# Patient Record
Sex: Female | Born: 1980 | Race: Black or African American | Hispanic: No | Marital: Married | Smoking: Never smoker
Health system: Southern US, Community
[De-identification: ages and names within clinical notes are randomized; demographics above are authoritative.]

## PROBLEM LIST (undated history)

## (undated) ENCOUNTER — Emergency Department (HOSPITAL_COMMUNITY): Admission: EM | Discharge status: Absent without leave | Payer: 59

## (undated) ENCOUNTER — Inpatient Hospital Stay (HOSPITAL_COMMUNITY): Payer: Self-pay

## (undated) DIAGNOSIS — R002 Palpitations: Secondary | ICD-10-CM

## (undated) DIAGNOSIS — K219 Gastro-esophageal reflux disease without esophagitis: Secondary | ICD-10-CM

## (undated) DIAGNOSIS — M1990 Unspecified osteoarthritis, unspecified site: Secondary | ICD-10-CM

## (undated) DIAGNOSIS — G4711 Idiopathic hypersomnia with long sleep time: Secondary | ICD-10-CM

## (undated) DIAGNOSIS — K589 Irritable bowel syndrome without diarrhea: Secondary | ICD-10-CM

## (undated) DIAGNOSIS — M549 Dorsalgia, unspecified: Secondary | ICD-10-CM

## (undated) DIAGNOSIS — E119 Type 2 diabetes mellitus without complications: Secondary | ICD-10-CM

## (undated) DIAGNOSIS — G629 Polyneuropathy, unspecified: Secondary | ICD-10-CM

## (undated) HISTORY — DX: Polyneuropathy, unspecified: G62.9

## (undated) HISTORY — DX: Type 2 diabetes mellitus without complications: E11.9

## (undated) HISTORY — DX: Idiopathic hypersomnia with long sleep time: G47.11

## (undated) HISTORY — PX: BREAST SURGERY: SHX581

## (undated) HISTORY — DX: Dorsalgia, unspecified: M54.9

---

## 2012-01-20 ENCOUNTER — Emergency Department (HOSPITAL_COMMUNITY)
Admission: EM | Admit: 2012-01-20 | Discharge: 2012-01-20 | Disposition: A | Discharge status: Home / Self Care | Payer: No Typology Code available for payment source | Attending: Emergency Medicine | Admitting: Emergency Medicine

## 2012-01-20 ENCOUNTER — Encounter (HOSPITAL_COMMUNITY): Payer: Self-pay | Admitting: *Deleted

## 2012-01-20 ENCOUNTER — Emergency Department (HOSPITAL_COMMUNITY): Payer: No Typology Code available for payment source

## 2012-01-20 DIAGNOSIS — M25519 Pain in unspecified shoulder: Secondary | ICD-10-CM | POA: Insufficient documentation

## 2012-01-20 DIAGNOSIS — M542 Cervicalgia: Secondary | ICD-10-CM | POA: Insufficient documentation

## 2012-01-20 DIAGNOSIS — Y92410 Unspecified street and highway as the place of occurrence of the external cause: Secondary | ICD-10-CM | POA: Insufficient documentation

## 2012-01-20 HISTORY — DX: Palpitations: R00.2

## 2012-01-20 MED ORDER — DIAZEPAM 5 MG PO TABS: 5.0000 mg | ORAL_TABLET | Freq: Two times a day (BID) | ORAL | Status: AC

## 2012-01-20 MED ORDER — HYDROCODONE-ACETAMINOPHEN 5-325 MG PO TABS: 1.0000 | ORAL_TABLET | ORAL | Status: AC | PRN
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
## 2012-01-20 NOTE — ED Provider Notes (Signed)
Medical screening examination/treatment/procedure(s) were performed by non-physician practitioner and as supervising physician I was immediately available for consultation/collaboration.  Ethelda Chick, MD 01/20/12 1630
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
## 2013-09-12 ENCOUNTER — Emergency Department (HOSPITAL_COMMUNITY)
Admission: EM | Admit: 2013-09-12 | Discharge: 2013-09-12 | Disposition: A | Discharge status: Home / Self Care | Payer: 59 | Attending: Emergency Medicine | Admitting: Emergency Medicine

## 2013-09-12 ENCOUNTER — Encounter (HOSPITAL_COMMUNITY): Payer: Self-pay | Admitting: Emergency Medicine

## 2013-09-12 DIAGNOSIS — M549 Dorsalgia, unspecified: Secondary | ICD-10-CM | POA: Insufficient documentation

## 2013-09-12 DIAGNOSIS — X500XXA Overexertion from strenuous movement or load, initial encounter: Secondary | ICD-10-CM | POA: Insufficient documentation

## 2013-09-12 DIAGNOSIS — S161XXA Strain of muscle, fascia and tendon at neck level, initial encounter: Secondary | ICD-10-CM

## 2013-09-12 DIAGNOSIS — X503XXA Overexertion from repetitive movements, initial encounter: Secondary | ICD-10-CM | POA: Insufficient documentation

## 2013-09-12 DIAGNOSIS — S139XXA Sprain of joints and ligaments of unspecified parts of neck, initial encounter: Secondary | ICD-10-CM | POA: Insufficient documentation

## 2013-09-12 DIAGNOSIS — Y9229 Other specified public building as the place of occurrence of the external cause: Secondary | ICD-10-CM | POA: Insufficient documentation

## 2013-09-12 DIAGNOSIS — Y990 Civilian activity done for income or pay: Secondary | ICD-10-CM | POA: Insufficient documentation

## 2013-09-12 DIAGNOSIS — Y9389 Activity, other specified: Secondary | ICD-10-CM | POA: Insufficient documentation

## 2013-09-12 MED ORDER — IBUPROFEN 800 MG PO TABS: 800.0000 mg | ORAL_TABLET | Freq: Three times a day (TID) | ORAL | Status: DC

## 2013-09-12 MED ORDER — HYDROCODONE-ACETAMINOPHEN 5-325 MG PO TABS: 1.0000 | ORAL_TABLET | ORAL | Status: DC | PRN

## 2013-09-12 MED ORDER — IBUPROFEN 400 MG PO TABS
800.0000 mg | ORAL_TABLET | Freq: Once | ORAL | Status: AC
  Administered 2013-09-12: 800 mg via ORAL
  Filled 2013-09-12: qty 2

## 2013-09-12 MED ORDER — METHOCARBAMOL 500 MG PO TABS: 500.0000 mg | ORAL_TABLET | Freq: Two times a day (BID) | ORAL | Status: DC

## 2013-09-12 MED ORDER — METHOCARBAMOL 500 MG PO TABS
750.0000 mg | ORAL_TABLET | Freq: Once | ORAL | Status: AC
  Administered 2013-09-12: 750 mg via ORAL
  Filled 2013-09-12: qty 2

## 2013-09-12 MED ORDER — HYDROCODONE-ACETAMINOPHEN 5-325 MG PO TABS
2.0000 | ORAL_TABLET | Freq: Once | ORAL | Status: AC
  Administered 2013-09-12: 2 via ORAL
  Filled 2013-09-12: qty 2

## 2013-09-12 NOTE — ED Notes (Signed)
C/o mid upper back pain, also some neck pain and HA, (denies: injury; denies other sx), took 2 - 500mg  tylenol at 2000. Pain onset this am upon waking at 1000, went back to bed and woke with unresolved pain. CMS intact, MAEx4, pain worse with movement.

## 2013-09-12 NOTE — ED Provider Notes (Signed)
CSN: [id number]     Arrival date & time 09/12/13  2039 History  This chart was scribed for non-physician practitioner Dierdre Forth, PA-C working with Junius Argyle, MD by Valera Castle, ED scribe. This patient was seen in room TR10C/TR10C and the patient's care was started at 10:39 PM.   Chief Complaint  Patient presents with  . Neck Pain  . Back Pain   The history is provided by the patient and medical records. No language interpreter was used.   HPI Comments: Jasmine Mckenzie is a 32 y.o. female who presents to the Emergency Department complaining of sudden, moderate, constant neck pain and upper back pain, onset this morning after waking up. 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 She states she had a baby not too long ago and just returned to work one week ago after a 7 week leave. She denies any known injury, falls, h/o neck and back pain. She reports taking Tylenol for the pain PTA, with no relief. She denies any allergies. She reports being on birth control. She denies fever, chills, nausea, emesis, rash, and any other associated symptoms. She denies any medical history. She requests a note for work.   PCP -No PCP Per Patient  Past Medical History  Diagnosis Date  . Heart palpitations    History reviewed. No pertinent past surgical history. Family History  Problem Relation Age of Onset  . Hypertension Mother   . Diabetes Father   . Hypertension Father    History  Substance Use Topics  . Smoking status: Never Smoker   . Smokeless tobacco: Never Used  . Alcohol Use: No   OB History   Grav Para Term Preterm Abortions TAB SAB Ect Mult Living                 Review of Systems  Constitutional: Negative for fever and fatigue.  Respiratory: Negative for chest tightness and shortness of breath.   Cardiovascular: Negative for chest pain.  Gastrointestinal: Negative for nausea, vomiting, abdominal pain and diarrhea.  Genitourinary:  Negative for dysuria, urgency, frequency and hematuria.  Musculoskeletal: Positive for back pain and neck pain. Negative for gait problem, joint swelling and neck stiffness.  Skin: Negative for rash.  Neurological: Negative for weakness, light-headedness, numbness and headaches.  All other systems reviewed and are negative.   Allergies  Review of patient's allergies indicates no known allergies.  Home Medications   Current Outpatient Rx  Name  Route  Sig  Dispense  Refill  . acetaminophen (TYLENOL) 500 MG tablet   Oral   Take 1,000 mg by mouth every 6 (six) hours as needed for moderate pain.         Marland Kitchen etonogestrel (IMPLANON) 68 MG IMPL implant   Subcutaneous   Inject 1 each into the skin once.         Marland Kitchen HYDROcodone-acetaminophen (NORCO/VICODIN) 5-325 MG per tablet   Oral   Take 1 tablet by mouth every 4 (four) hours as needed.   6 tablet   0   . ibuprofen (ADVIL,MOTRIN) 800 MG tablet   Oral   Take 1 tablet (800 mg total) by mouth 3 (three) times daily.   21 tablet   0   . methocarbamol (ROBAXIN) 500 MG tablet   Oral   Take 1 tablet (500 mg total) by mouth 2 (two) times daily.   20 tablet   0    Triage Vitals: BP 128/85  Pulse  66  Temp(Src) 98.8 F (37.1 C)  Resp 18  Wt 145 lb (65.772 kg)  SpO2 98%  LMP 08/27/2013  Physical Exam  Nursing note and vitals reviewed. Constitutional: She is oriented to person, place, and time. She appears well-developed and well-nourished. No distress.  HENT:  Head: Normocephalic and atraumatic.  Mouth/Throat: Oropharynx is clear and moist. No oropharyngeal exudate.  Eyes: Conjunctivae are normal.  Neck: Neck supple. Muscular tenderness present. No spinous process tenderness present. No rigidity. Normal range of motion present.  Full ROM with mild pain Bilateral paraspinal tenderness; no midline tenderness  Cardiovascular: Normal rate, regular rhythm and intact distal pulses.   Pulmonary/Chest: Effort normal and breath  sounds normal. No respiratory distress. She has no wheezes.  Abdominal: Soft. She exhibits no distension. There is no tenderness.  Musculoskeletal: She exhibits tenderness. She exhibits no edema.  Full range of motion of the T-spine and L-spine No tenderness to palpation of the spinous processes of the T-spine or L-spine. No midline tenderness. Bilateral paraspinal tenderness to palpation. Palpable muscle spasm over trapezius.  Lymphadenopathy:    She has no cervical adenopathy.  Neurological: She is alert and oriented to person, place, and time. She has normal reflexes. GCS eye subscore is 4. GCS verbal subscore is 5. GCS motor subscore is 6.  Reflex Scores:      Tricep reflexes are 2+ on the right side and 2+ on the left side.      Bicep reflexes are 2+ on the right side and 2+ on the left side.      Brachioradialis reflexes are 2+ on the right side and 2+ on the left side.      Patellar reflexes are 2+ on the right side and 2+ on the left side.      Achilles reflexes are 2+ on the right side and 2+ on the left side. Speech is clear and goal oriented, follows commands Normal strength in upper and lower extremities bilaterally including dorsiflexion and plantar flexion, strong and equal grip strength Sensation normal to light and sharp touch Moves extremities without ataxia, coordination intact Normal gait Normal balance   Skin: Skin is warm and dry. No rash noted. She is not diaphoretic. No erythema.    ED Course  Procedures (including critical care time)  DIAGNOSTIC STUDIES: Oxygen Saturation is 98% on room air, normal by my interpretation.    COORDINATION OF CARE: 10:44 PM-Discussed treatment plan with pt at bedside and pt agreed to plan. Advised pt to f/u if pain worsens.  Labs Review Labs Reviewed - No data to display Imaging Review No results found.  EKG Interpretation   None       MDM   1. Cervical strain, acute, initial encounter      Elby Showers presents  with cervical paraspinal neck pain; no midline tenderness. 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 No neurological deficits and normal neuro exam.  Patient can walk without difficulty.  No loss of bowel or bladder control.  No concern for cauda equina.  No fever, night sweats, weight loss, h/o cancer, IVDU. Likely cervical strain secondary to increased lifting at work. RICE protocol and pain medicine indicated and discussed with patient.   It has been determined that no acute conditions requiring further emergency intervention are present at this time. The patient/guardian have been advised of the diagnosis and plan. We have discussed signs and symptoms that warrant  return to the ED, such as changes or worsening in symptoms.   Vital signs are stable at discharge.   BP 128/85  Pulse 66  Temp(Src) 98.8 F (37.1 C)  Resp 18  Wt 145 lb (65.772 kg)  SpO2 98%  LMP 08/27/2013  Patient/guardian has voiced understanding and agreed to follow-up with the PCP or specialist.    I personally performed the services described in this documentation, which was scribed in my presence. The recorded information has been reviewed and is accurate.    Dahlia Client Myracle Febres, PA-C 09/12/13 2307

## 2013-09-12 NOTE — ED Notes (Signed)
Pt  States that she woke up this morning with neck and back pain. Denies injury, fall, or trauma.

## 2013-09-13 NOTE — ED Provider Notes (Signed)
Medical screening examination/treatment/procedure(s) were performed by non-physician practitioner and as supervising physician I was immediately available for consultation/collaboration.  EKG Interpretation   None         Ibrahem Volkman S Berthel Bagnall, MD 09/13/13 0047 

## 2015-03-21 ENCOUNTER — Encounter (HOSPITAL_COMMUNITY): Payer: Self-pay | Admitting: Emergency Medicine

## 2015-03-21 ENCOUNTER — Emergency Department (INDEPENDENT_AMBULATORY_CARE_PROVIDER_SITE_OTHER)
Admission: EM | Admit: 2015-03-21 | Discharge: 2015-03-21 | Disposition: A | Discharge status: Home / Self Care | Payer: 59 | Source: Home / Self Care | Attending: Family Medicine | Admitting: Family Medicine

## 2015-03-21 DIAGNOSIS — B373 Candidiasis of vulva and vagina: Secondary | ICD-10-CM | POA: Diagnosis not present

## 2015-03-21 DIAGNOSIS — B3731 Acute candidiasis of vulva and vagina: Secondary | ICD-10-CM

## 2015-03-21 LAB — POCT URINALYSIS DIP (DEVICE)
BILIRUBIN URINE: NEGATIVE
Glucose, UA: NEGATIVE mg/dL
HGB URINE DIPSTICK: NEGATIVE
KETONES UR: NEGATIVE mg/dL
Nitrite: NEGATIVE
PH: 7 (ref 5.0–8.0)
Protein, ur: 30 mg/dL — AB
SPECIFIC GRAVITY, URINE: 1.025 (ref 1.005–1.030)
Urobilinogen, UA: 4 mg/dL — ABNORMAL HIGH (ref 0.0–1.0)

## 2015-03-21 LAB — POCT PREGNANCY, URINE: Preg Test, Ur: NEGATIVE

## 2015-03-21 MED ORDER — FLUCONAZOLE 150 MG PO TABS: 150.0000 mg | ORAL_TABLET | Freq: Every day | ORAL | Status: DC

## 2015-03-21 NOTE — ED Notes (Signed)
Pt noticed she started having vaginal itching a few days after she started antibiotics for breast augmentation.  She denies any other symptoms, except maybe some mild white discharge.

## 2015-03-21 NOTE — Discharge Instructions (Signed)
You likely have a yeast infection. Please take the Diflucan as prescribed. Please follow-up as needed for additional symptoms or go to your primary care physician or OB/GYN.
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
## 2015-08-03 ENCOUNTER — Ambulatory Visit (INDEPENDENT_AMBULATORY_CARE_PROVIDER_SITE_OTHER): Payer: 59 | Admitting: Emergency Medicine

## 2015-08-03 VITALS — BP 110/70 | HR 75 | Temp 98.8°F | Resp 16 | Ht 63.0 in | Wt 140.0 lb

## 2015-08-03 DIAGNOSIS — N912 Amenorrhea, unspecified: Secondary | ICD-10-CM

## 2015-08-03 DIAGNOSIS — N898 Other specified noninflammatory disorders of vagina: Secondary | ICD-10-CM | POA: Diagnosis not present

## 2015-08-03 LAB — POCT WET + KOH PREP
Trich by wet prep: ABSENT
YEAST BY KOH: ABSENT
Yeast by wet prep: ABSENT

## 2015-08-03 LAB — HIV ANTIBODY (ROUTINE TESTING W REFLEX): HIV 1&2 Ab, 4th Generation: NONREACTIVE

## 2015-08-03 LAB — RPR

## 2015-08-03 LAB — HEPATITIS C ANTIBODY: HCV Ab: NEGATIVE

## 2015-08-03 LAB — POCT URINE PREGNANCY: PREG TEST UR: NEGATIVE

## 2015-08-03 MED ORDER — METRONIDAZOLE 500 MG PO TABS: 500.0000 mg | ORAL_TABLET | Freq: Two times a day (BID) | ORAL | Status: DC

## 2015-08-03 NOTE — Progress Notes (Addendum)
This chart was scribed for Lesle Chris, MD by Stann Ore, Medical Scribe. This patient was seen in Room 11 and the patient's care was started 12:39 PM.  Chief Complaint:  Chief Complaint  Patient presents with  . Vaginal Discharge    oder/ x 2 days  . Amenorrhea    Pt. missed period, last period was sept. 2nd    HPI: Jasmine Mckenzie is a 34 y.o. female who reports to [facility] today complaining of vaginal discharge with odor for the past 2 days.  When she usually has this discharge, she was informed that she has a bacterial infection. The last occurrence was a few months ago. She denies any new sexual activity and new sexual partners.   Pt also states that she missed her period. Her last period was Sept 2nd. She denies abd pain.   Past Medical History  Diagnosis Date  . Heart palpitations    Past Surgical History  Procedure Laterality Date  . Breast surgery     Social History   Social History  . Marital Status: Married    Spouse Name: N/A  . Number of Children: N/A  . Years of Education: N/A   Social History Main Topics  . Smoking status: Never Smoker   . Smokeless tobacco: Never Used  . Alcohol Use: No  . Drug Use: No  . Sexual Activity: Yes   Other Topics Concern  . Not on file   Social History Narrative   Family History  Problem Relation Age of Onset  . Hypertension Mother   . Diabetes Father   . Hypertension Father    No Known Allergies Prior to Admission medications   Medication Sig Start Date End Date Taking? Authorizing Provider  etonogestrel (IMPLANON) 68 MG IMPL implant Inject 1 each into the skin once.   Yes Historical Provider, MD  acetaminophen (TYLENOL) 500 MG tablet Take 1,000 mg by mouth every 6 (six) hours as needed for moderate pain.    Historical Provider, MD  fluconazole (DIFLUCAN) 150 MG tablet Take 1 tablet (150 mg total) by mouth daily. Repeat dose in 3 days Patient not taking: Reported on 08/03/2015 03/21/15   Ozella Rocks, MD      ROS:  Constitutional: negative for chills, fever, night sweats, weight changes, or fatigue  HEENT: negative for vision changes, hearing loss, congestion, rhinorrhea, ST, epistaxis, or sinus pressure Cardiovascular: negative for chest pain or palpitations Respiratory: negative for hemoptysis, wheezing, shortness of breath, or cough Abdominal: negative for abdominal pain, nausea, vomiting, diarrhea, or constipation Dermatological: negative for rash Neurologic: negative for headache, dizziness, or syncope GU: positive for vaginal discharge, amenorrhea All other systems reviewed and are otherwise negative with the exception to those above and in the HPI.  PHYSICAL EXAM: Filed Vitals:   08/03/15 1130  BP: 110/70  Pulse: 75  Temp: 98.8 F (37.1 C)  Resp: 16   Body mass index is 24.81 kg/(m^2).   General: Alert, no acute distress HEENT:  Normocephalic, atraumatic, oropharynx patent. Eye: Nonie Hoyer [facility] Cardiovascular:  Regular rate and rhythm, no rubs murmurs or gallops.  No Carotid bruits, radial pulse intact. No pedal edema.  Respiratory: Clear to auscultation bilaterally.  No wheezes, rales, or rhonchi.  No cyanosis, no use of accessory musculature Abdominal: No organomegaly, abdomen is soft and non-tender, positive bowel sounds. No masses. Musculoskeletal: Gait intact. No edema, tenderness Skin: No rashes. Neurologic: Facial musculature symmetric. Psychiatric: Patient acts appropriately throughout our interaction.  Lymphatic: No cervical  or submandibular lymphadenopathy Genitourinary/Anorectal: has a significant odorous vaginal discharge, cervical erosion; no tenderness over uterus, no adnexal tenderness   LABS: Results for orders placed or performed in visit on 08/03/15  POCT urine pregnancy  Result Value Ref Range   Preg Test, Ur Negative Negative  POCT Wet + KOH Prep ([facility])  Result Value Ref Range   Yeast by KOH Absent Present, Absent   Yeast by wet prep Absent  Present, Absent   WBC by wet prep Moderate (A) None, Few   Clue Cells Wet Prep HPF POC None None   Trich by wet prep Absent Present, Absent   Bacteria Wet Prep HPF POC Few None, Few   Epithelial Cells By [employer] Pref ([facility]) Moderate (A) None, Few   RBC,UR,HPF,POC None None RBC/hpf     EKG/XRAY:   Primary read interpreted by Dr. Cleta Alberts at [facility]   ASSESSMENT/PLAN: Physical exam was most consistent with BV. I did go ahead and do her cultures as well as STD screening. Will treat with Flagyl. I will call when I get the results back of her other tests.  By signing my name below, I, Stann Ore, attest that this documentation has been prepared under the direction and in the presence of Lesle Chris, MD. Electronically Signed: Stann Ore, Scribe. 08/03/2015 , 12:39 PM . I personally performed the services described in this documentation, which was scribed in my presence. The recorded information has been reviewed and is accurate.   Gross sideeffects, risk and benefits, and alternatives of medications d/w patient. Patient is aware that all medications have potential sideeffects and we are unable to predict every sideeffect or drug-drug interaction that may occur.  Lesle Chris MD 08/03/2015 12:39 PM

## 2015-08-03 NOTE — Patient Instructions (Signed)
Bacterial Vaginosis Bacterial vaginosis is a vaginal infection that occurs when the normal balance of bacteria in the vagina is disrupted. It results from an overgrowth of certain bacteria. This is the most common vaginal infection in women of childbearing age. Treatment is important to prevent complications, especially in pregnant women, as it can cause a premature delivery. CAUSES  Bacterial vaginosis is caused by an increase in harmful bacteria that are normally present in smaller amounts in the vagina. Several different kinds of bacteria can cause bacterial vaginosis. However, the reason that the condition develops is not fully understood. RISK FACTORS Certain activities or behaviors can put you at an increased risk of developing bacterial vaginosis, including:  Having a new sex partner or multiple sex partners.  Douching.  Using an intrauterine device (IUD) for contraception. Women do not get bacterial vaginosis from toilet seats, bedding, swimming pools, or contact with objects around them. SIGNS AND SYMPTOMS  Some women with bacterial vaginosis have no signs or symptoms. Common symptoms include:  Grey vaginal discharge.  A fishlike odor with discharge, especially after sexual intercourse.  Itching or burning of the vagina and vulva.  Burning or pain with urination. DIAGNOSIS  Your health care provider will take a medical history and examine the vagina for signs of bacterial vaginosis. A sample of vaginal fluid may be taken. Your health care provider will look at this sample under a microscope to check for bacteria and abnormal cells. A vaginal pH test may also be done.  TREATMENT  Bacterial vaginosis may be treated with antibiotic medicines. These may be given in the form of a pill or a vaginal cream. A second round of antibiotics may be prescribed if the condition comes back after treatment.  HOME CARE INSTRUCTIONS   Only take over-the-counter or prescription medicines as  directed by your health care provider.  If antibiotic medicine was prescribed, take it as directed. Make sure you finish it even if you start to feel better.  Do not have sex until treatment is completed.  Tell all sexual partners that you have a vaginal infection. They should see their health care provider and be treated if they have problems, such as a mild rash or itching.  Practice safe sex by using condoms and only having one sex partner. SEEK MEDICAL CARE IF:   Your symptoms are not improving after 3 days of treatment.  You have increased discharge or pain.  You have a fever. MAKE SURE YOU:   Understand these instructions.  Will watch your condition.  Will get help right away if you are not doing well or get worse. FOR MORE INFORMATION  Centers for Disease Control and Prevention, Division of STD Prevention: www.cdc.gov/std American Sexual Health Association (ASHA): www.ashastd.org  Document Released: 10/18/2005 Document Revised: 08/08/2013 Document Reviewed: 05/30/2013 ExitCare Patient Information 2015 [employer]. This information is not intended to replace advice given to you by your health care provider. Make sure you discuss any questions you have with your health care provider.  

## 2015-08-05 LAB — GC/CHLAMYDIA PROBE AMP
CT PROBE, AMP APTIMA: NEGATIVE
GC PROBE AMP APTIMA: NEGATIVE

## 2016-04-02 ENCOUNTER — Ambulatory Visit
Admission: RE | Admit: 2016-04-02 | Discharge: 2016-04-02 | Disposition: A | Discharge status: Home / Self Care | Payer: 59 | Source: Ambulatory Visit | Attending: Physician Assistant | Admitting: Physician Assistant

## 2016-04-02 ENCOUNTER — Other Ambulatory Visit: Payer: Self-pay | Admitting: Physician Assistant

## 2016-04-02 DIAGNOSIS — K5900 Constipation, unspecified: Secondary | ICD-10-CM

## 2016-04-02 DIAGNOSIS — R1030 Lower abdominal pain, unspecified: Secondary | ICD-10-CM

## 2016-06-21 ENCOUNTER — Ambulatory Visit (INDEPENDENT_AMBULATORY_CARE_PROVIDER_SITE_OTHER): Payer: 59 | Admitting: Family Medicine

## 2016-06-21 ENCOUNTER — Encounter: Payer: Self-pay | Admitting: Family Medicine

## 2016-06-21 VITALS — BP 118/78 | HR 73 | Temp 98.3°F | Resp 17 | Ht 63.0 in | Wt 146.0 lb

## 2016-06-21 DIAGNOSIS — R319 Hematuria, unspecified: Secondary | ICD-10-CM | POA: Diagnosis not present

## 2016-06-21 DIAGNOSIS — B9689 Other specified bacterial agents as the cause of diseases classified elsewhere: Secondary | ICD-10-CM

## 2016-06-21 DIAGNOSIS — N898 Other specified noninflammatory disorders of vagina: Secondary | ICD-10-CM | POA: Diagnosis not present

## 2016-06-21 DIAGNOSIS — R350 Frequency of micturition: Secondary | ICD-10-CM | POA: Diagnosis not present

## 2016-06-21 DIAGNOSIS — A499 Bacterial infection, unspecified: Secondary | ICD-10-CM

## 2016-06-21 DIAGNOSIS — N760 Acute vaginitis: Secondary | ICD-10-CM

## 2016-06-21 LAB — POCT URINALYSIS DIP (MANUAL ENTRY)
BILIRUBIN UA: NEGATIVE
BILIRUBIN UA: NEGATIVE
Glucose, UA: NEGATIVE
Nitrite, UA: NEGATIVE
PH UA: 6.5
PROTEIN UA: NEGATIVE
Spec Grav, UA: 1.02
Urobilinogen, UA: 0.2

## 2016-06-21 LAB — POC MICROSCOPIC URINALYSIS (UMFC): Mucus: ABSENT

## 2016-06-21 LAB — POCT WET + KOH PREP
TRICH BY WET PREP: ABSENT
YEAST BY KOH: ABSENT
YEAST BY WET PREP: ABSENT

## 2016-06-21 MED ORDER — METRONIDAZOLE 500 MG PO TABS: ORAL_TABLET | ORAL | 0 refills | Status: DC

## 2016-06-21 NOTE — Progress Notes (Signed)
Subjective:  By signing my name below, I, Jasmine Mckenzie, attest that this documentation has been prepared under the direction and in the presence of Meredith Staggers, MD. Electronically Signed: Stann Mckenzie, Scribe. 06/21/2016 , 5:47 PM .  Patient was seen in Room 8 .   Patient ID: Jasmine Mckenzie, female    DOB: Oct 17, 1981, 35 y.o.   MRN: [id number] Chief Complaint  Patient presents with  . Urinary Frequency    x1week. pt also states unusual odor to urine.    HPI Jasmine Mckenzie is a 35 y.o. female  Patient complains vaginal discharge with odor, as well as urinary frequency and urine smelling like ammonia, which started about a week ago. She mentions having vaginal irritation and itchiness. She notes drinking a lot of water to stay hydrated. She denies taking anything OTC for her symptoms. She denies taking antibiotics recently. She denies fever, chills, nausea or vomiting.   She also has some abdominal bloating. She has a history of IBS, and is followed by a GI doctor.   She informs having spotting. She has an implanon in place. She denies genital blister or rash. She denies any new sexual partners. She denies history of STI's. She notes having OBGYN appointment next week.   There are no active problems to display for this patient.  Past Medical History:  Diagnosis Date  . Heart palpitations    Past Surgical History:  Procedure Laterality Date  . BREAST SURGERY     No Known Allergies Prior to Admission medications   Medication Sig Start Date End Date Taking? Authorizing Provider  acetaminophen (TYLENOL) 500 MG tablet Take 1,000 mg by mouth every 6 (six) hours as needed for moderate pain.   Yes Historical Provider, MD  etonogestrel (IMPLANON) 68 MG IMPL implant Inject 1 each into the skin once.   Yes Historical Provider, MD  fluconazole (DIFLUCAN) 150 MG tablet Take 1 tablet (150 mg total) by mouth daily. Repeat dose in 3 days Patient not taking: Reported on 08/03/2015 03/21/15    Ozella Rocks, MD  metroNIDAZOLE (FLAGYL) 500 MG tablet Take 1 tablet (500 mg total) by mouth 2 (two) times daily. Patient not taking: Reported on 06/21/2016 08/03/15   Collene Gobble, MD   Social History   Social History  . Marital status: Single    Spouse name: N/A  . Number of children: N/A  . Years of education: N/A   Occupational History  . Not on file.   Social History Main Topics  . Smoking status: Never Smoker  . Smokeless tobacco: Never Used  . Alcohol use No  . Drug use: No  . Sexual activity: Yes   Other Topics Concern  . Not on file   Social History Narrative  . No narrative on file   Review of Systems  Constitutional: Negative for chills, fatigue and fever.  Gastrointestinal: Positive for abdominal distention. Negative for diarrhea and vomiting.  Genitourinary: Positive for frequency, menstrual problem, vaginal bleeding, vaginal discharge and vaginal pain. Negative for dysuria and genital sores.       Objective:   Physical Exam  Constitutional: She is oriented to person, place, and time. She appears well-developed and well-nourished. No distress.  HENT:  Head: Normocephalic and atraumatic.  Eyes: EOM are normal. Pupils are equal, round, and reactive to light.  Neck: Neck supple.  Cardiovascular: Normal rate.   Pulmonary/Chest: Effort normal. No respiratory distress.  Abdominal: There is no tenderness. There is no CVA tenderness.  No focal tenderness over abdomen  Musculoskeletal: Normal range of motion.  Neurological: She is alert and oriented to person, place, and time.  Skin: Skin is warm and dry.  Psychiatric: She has a normal mood and affect. Her behavior is normal.  Nursing note and vitals reviewed.   Vitals:   06/21/16 1705  BP: 118/78  Pulse: 73  Resp: 17  Temp: 98.3 F (36.8 C)  TempSrc: Oral  SpO2: 100%  Weight: 146 lb (66.2 kg)  Height: 5\' 3"  (1.6 m)   Results for orders placed or performed in visit on 06/21/16  POCT urinalysis  dipstick  Result Value Ref Range   Color, UA yellow yellow   Clarity, UA clear clear   Glucose, UA negative negative   Bilirubin, UA negative negative   Ketones, POC UA negative negative   Spec Grav, UA 1.020    Blood, UA small (A) negative   pH, UA 6.5    Protein Ur, POC negative negative   Urobilinogen, UA 0.2    Nitrite, UA Negative Negative   Leukocytes, UA small (1+) (A) Negative  POCT Microscopic Urinalysis ([facility])  Result Value Ref Range   WBC,UR,HPF,POC Few (A) None WBC/hpf   RBC,UR,HPF,POC Few (A) None RBC/hpf   Bacteria None None, Too numerous to count   Mucus Absent Absent   Epithelial Cells, UR Per Microscopy Few (A) None, Too numerous to count cells/hpf  POCT Wet + KOH Prep  Result Value Ref Range   Yeast by KOH Absent Present, Absent   Yeast by wet prep Absent Present, Absent   WBC by wet prep Too numerous to count  None, Few, Too numerous to count   Clue Cells Wet Prep HPF POC Few (A) None, Too numerous to count   Trich by wet prep Absent Present, Absent   Bacteria Wet Prep HPF POC Moderate (A) None, Few, Too numerous to count   Epithelial Cells By [employer] Pref ([facility]) Many (A) None, Few, Too numerous to count   RBC,UR,HPF,POC Moderate (A) None RBC/hpf       Assessment & Plan:     Jasmine ShowersLinda Ann Rush is a 35 y.o. female Urinary frequency - Plan: POCT urinalysis dipstick, POCT Microscopic Urinalysis ([facility]), Urine culture  Vaginal discharge - Plan: POCT Wet + KOH Prep  Bacterial vaginosis - Plan: metroNIDAZOLE (FLAGYL) 500 MG tablet  Hematuria  Suspected mild bacterial vaginosis. Start Flagyl. Urine culture obtained, but unlikely urinary tract infection. Hematuria may be due to spotting/vaginal bleeding. Has follow-up with OB/GYN to discuss further. Recommended repeat urinalysis once spotting has cleared to make sure hematuria also clears.  Meds ordered this encounter  Medications  . metroNIDAZOLE (FLAGYL) 500 MG tablet    Sig: 1 pill by mouth twice per day.   Avoid any alcohol while taking this medicine.    Dispense:  14 tablet    Refill:  0   Patient Instructions   Your test indicated a possible mild infection - bacterial vaginosis. You can take the antibiotic Flagyl twice per day for 1 week.  The urine test indicated a few white blood cells or red blood cells, but less likely infection. I will check a urine culture to make sure, but if any worsening of your urinary symptoms, return for recheck.   I would also recommend you return for a repeat urinalysis or urine test within the next 2-3 weeks as there was a small amount of possible blood in the urine.    Bacterial Vaginosis Bacterial vaginosis is  a vaginal infection that occurs when the normal balance of bacteria in the vagina is disrupted. It results from an overgrowth of certain bacteria. This is the most common vaginal infection in women of childbearing age. Treatment is important to prevent complications, especially in pregnant women, as it can cause a premature delivery. CAUSES  Bacterial vaginosis is caused by an increase in harmful bacteria that are normally present in smaller amounts in the vagina. Several different kinds of bacteria can cause bacterial vaginosis. However, the reason that the condition develops is not fully understood. RISK FACTORS Certain activities or behaviors can put you at an increased risk of developing bacterial vaginosis, including:  Having a new sex partner or multiple sex partners.  Douching.  Using an intrauterine device (IUD) for contraception. Women do not get bacterial vaginosis from toilet seats, bedding, swimming pools, or contact with objects around them. SIGNS AND SYMPTOMS  Some women with bacterial vaginosis have no signs or symptoms. Common symptoms include:  Grey vaginal discharge.  A fishlike odor with discharge, especially after sexual intercourse.  Itching or burning of the vagina and vulva.  Burning or pain with urination. DIAGNOSIS    Your health care provider will take a medical history and examine the vagina for signs of bacterial vaginosis. A sample of vaginal fluid may be taken. Your health care provider will look at this sample under a microscope to check for bacteria and abnormal cells. A vaginal pH test may also be done.  TREATMENT  Bacterial vaginosis may be treated with antibiotic medicines. These may be given in the form of a pill or a vaginal cream. A second round of antibiotics may be prescribed if the condition comes back after treatment. Because bacterial vaginosis increases your risk for sexually transmitted diseases, getting treated can help reduce your risk for chlamydia, gonorrhea, HIV, and herpes. HOME CARE INSTRUCTIONS   Only take over-the-counter or prescription medicines as directed by your health care provider.  If antibiotic medicine was prescribed, take it as directed. Make sure you finish it even if you start to feel better.  Tell all sexual partners that you have a vaginal infection. They should see their health care provider and be treated if they have problems, such as a mild rash or itching.  During treatment, it is important that you follow these instructions:  Avoid sexual activity or use condoms correctly.  Do not douche.  Avoid alcohol as directed by your health care provider.  Avoid breastfeeding as directed by your health care provider. SEEK MEDICAL CARE IF:   Your symptoms are not improving after 3 days of treatment.  You have increased discharge or pain.  You have a fever. MAKE SURE YOU:   Understand these instructions.  Will watch your condition.  Will get help right away if you are not doing well or get worse. FOR MORE INFORMATION  Centers for Disease Control and Prevention, Division of STD Prevention: [employer]s.co.zawww.cdc.gov/std American Sexual Health Association (ASHA): www.ashastd.org    This information is not intended to replace advice given to you by your health care  provider. Make sure you discuss any questions you have with your health care provider.   Document Released: 10/18/2005 Document Revised: 11/08/2014 Document Reviewed: 05/30/2013 Elsevier Interactive Patient Education 2016 [employer]   IF you received an x-ray today, you will receive an invoice from [facility]. Please contact [facility] at [phone] with questions or concerns regarding your invoice.   IF you received labwork today, you will receive an  invoice from [employer]. Please contact [facility] at [phone] with questions or concerns regarding your invoice.   Our billing staff will not be able to assist you with questions regarding bills from these companies.  You will be contacted with the lab results as soon as they are available. The fastest way to get your results is to activate your My Chart account. Instructions are located on the last page of this paperwork. If you have not heard from [place] regarding the results in 2 weeks, please contact this office.        I personally performed the services described in this documentation, which was scribed in my presence. The recorded information has been reviewed and considered, and addended by me as needed.   Signed,   Meredith Staggers, MD [facility].  06/23/16 4:21 PM

## 2016-06-21 NOTE — Patient Instructions (Addendum)
Your test indicated a possible mild infection - bacterial vaginosis. You can take the antibiotic Flagyl twice per day for 1 week.  The urine test indicated a few white blood cells or red blood cells, but less likely infection. I will check a urine culture to make sure, but if any worsening of your urinary symptoms, return for recheck.   I would also recommend you return for a repeat urinalysis or urine test within the next 2-3 weeks as there was a small amount of possible blood in the urine.    Bacterial Vaginosis Bacterial vaginosis is a vaginal infection that occurs when the normal balance of bacteria in the vagina is disrupted. It results from an overgrowth of certain bacteria. This is the most common vaginal infection in women of childbearing age. Treatment is important to prevent complications, especially in pregnant women, as it can cause a premature delivery. CAUSES  Bacterial vaginosis is caused by an increase in harmful bacteria that are normally present in smaller amounts in the vagina. Several different kinds of bacteria can cause bacterial vaginosis. However, the reason that the condition develops is not fully understood. RISK FACTORS Certain activities or behaviors can put you at an increased risk of developing bacterial vaginosis, including:  Having a new sex partner or multiple sex partners.  Douching.  Using an intrauterine device (IUD) for contraception. Women do not get bacterial vaginosis from toilet seats, bedding, swimming pools, or contact with objects around them. SIGNS AND SYMPTOMS  Some women with bacterial vaginosis have no signs or symptoms. Common symptoms include:  Grey vaginal discharge.  A fishlike odor with discharge, especially after sexual intercourse.  Itching or burning of the vagina and vulva.  Burning or pain with urination. DIAGNOSIS  Your health care provider will take a medical history and examine the vagina for signs of bacterial vaginosis. A  sample of vaginal fluid may be taken. Your health care provider will look at this sample under a microscope to check for bacteria and abnormal cells. A vaginal pH test may also be done.  TREATMENT  Bacterial vaginosis may be treated with antibiotic medicines. These may be given in the form of a pill or a vaginal cream. A second round of antibiotics may be prescribed if the condition comes back after treatment. Because bacterial vaginosis increases your risk for sexually transmitted diseases, getting treated can help reduce your risk for chlamydia, gonorrhea, HIV, and herpes. HOME CARE INSTRUCTIONS   Only take over-the-counter or prescription medicines as directed by your health care provider.  If antibiotic medicine was prescribed, take it as directed. Make sure you finish it even if you start to feel better.  Tell all sexual partners that you have a vaginal infection. They should see their health care provider and be treated if they have problems, such as a mild rash or itching.  During treatment, it is important that you follow these instructions:  Avoid sexual activity or use condoms correctly.  Do not douche.  Avoid alcohol as directed by your health care provider.  Avoid breastfeeding as directed by your health care provider. SEEK MEDICAL CARE IF:   Your symptoms are not improving after 3 days of treatment.  You have increased discharge or pain.  You have a fever. MAKE SURE YOU:   Understand these instructions.  Will watch your condition.  Will get help right away if you are not doing well or get worse. FOR MORE INFORMATION  Centers for Disease Control and Prevention, Division of STD  Prevention: SolutionApps.co.zawww.cdc.gov/std American Sexual Health Association (ASHA): www.ashastd.org    This information is not intended to replace advice given to you by your health care provider. Make sure you discuss any questions you have with your health care provider.   Document Released: 10/18/2005  Document Revised: 11/08/2014 Document Reviewed: 05/30/2013 Elsevier Interactive Patient Education 2016 [employer]   IF you received an x-ray today, you will receive an invoice from [facility]. Please contact [facility] at [phone] with questions or concerns regarding your invoice.   IF you received labwork today, you will receive an invoice from [facility]. Please contact [facility] at [phone] with questions or concerns regarding your invoice.   Our billing staff will not be able to assist you with questions regarding bills from these companies.  You will be contacted with the lab results as soon as they are available. The fastest way to get your results is to activate your My Chart account. Instructions are located on the last page of this paperwork. If you have not heard from us regarding the results in 2 weeks, please contact this office.

## 2016-06-23 LAB — URINE CULTURE

## 2016-07-30 ENCOUNTER — Ambulatory Visit (HOSPITAL_COMMUNITY)
Admission: RE | Admit: 2016-07-30 | Discharge: 2016-07-30 | Disposition: A | Discharge status: Home / Self Care | Payer: 59 | Source: Ambulatory Visit | Attending: Surgery | Admitting: Surgery

## 2016-07-30 ENCOUNTER — Other Ambulatory Visit (HOSPITAL_COMMUNITY): Payer: Self-pay | Admitting: Surgery

## 2016-07-30 ENCOUNTER — Ambulatory Visit: Payer: Self-pay | Admitting: Surgery

## 2016-07-30 ENCOUNTER — Other Ambulatory Visit: Payer: Self-pay | Admitting: Surgery

## 2016-07-30 DIAGNOSIS — K8020 Calculus of gallbladder without cholecystitis without obstruction: Secondary | ICD-10-CM | POA: Diagnosis not present

## 2016-07-30 DIAGNOSIS — R1011 Right upper quadrant pain: Secondary | ICD-10-CM

## 2016-07-30 DIAGNOSIS — R932 Abnormal findings on diagnostic imaging of liver and biliary tract: Secondary | ICD-10-CM | POA: Diagnosis not present

## 2016-07-30 NOTE — Progress Notes (Signed)
We will proceed with scheduling this case.  Thanks

## 2016-08-01 ENCOUNTER — Ambulatory Visit: Payer: Self-pay | Admitting: Surgery

## 2016-08-01 NOTE — H&P (Signed)
History of Present Illness Jasmine Mckenzie(Jasmine Mckenzie K. Jasmine Banghart MD; 08/01/2016 4:51 PM) Patient words: GB.  The patient is a 35 year old female who presents for evaluation of gall stones. Referred by Jasmine LucksJennifer Brown, NP for symptomatic gallstones  This is a 35 yo female who recently underwent a MRI for back issues. The scan showed an incidental finding of cholelithiasis. The patient has a long history of irritable bowel syndrome, including epigastric pain, nausea, vomiting, and bloating. This has been going on for almost 15 years, but has worsened over the last few years. She has had significant issues with constipation and is currently on Linzess with minimal improvement.   Addendum: In preparation for surgery, I obtained an ultrasound to confirm the MRI findings.  CLINICAL DATA: Right upper quadrant pain intermittently for several years  EXAM: ABDOMEN ULTRASOUND COMPLETE  COMPARISON: None in PACs  FINDINGS: Gallbladder: The gallbladder is adequately distended. There are multiple echogenic mobile shadowing stones. The largest measures 1.4 cm in diameter. There is no gallbladder wall thickening, pericholecystic fluid, or positive sonographic Murphy's sign.  Common bile duct: Diameter: 3.4 mm  Liver: The hepatic echotexture is mildly increased diffusely. There is no focal mass nor ductal dilation.  IVC: No abnormality visualized.  Pancreas: The pancreatic head and body are grossly normal. The pancreatic tail was partially obscured by bowel gas.  Spleen: Size and appearance within normal limits.  Right Kidney: Length: 11.8 cm. Echogenicity within normal limits. No mass or hydronephrosis visualized.  Left Kidney: Length: 11.3 cm. Echogenicity within normal limits. No mass or hydronephrosis visualized.  Abdominal aorta: No aneurysm visualized.  Other findings: There is no ascites.  IMPRESSION: Gallstones without sonographic evidence of acute cholecystitis.  Increased hepatic  echotexture consistent with fatty infiltration.   Electronically Signed By: Jasmine Mckenzie M.D. On: 07/30/2016 15:23     Other Problems (Jasmine Eversole, LPN; 1/61/09609/29/2017 45:4010:44 AM) Back Pain Cholelithiasis  Past Surgical History (Jasmine Eversole, LPN; 9/81/19149/29/2017 78:2910:44 AM) Breast Augmentation Bilateral. Foot Surgery Right.  Diagnostic Studies History (Jasmine Eversole, LPN; 5/62/13089/29/2017 65:7810:44 AM) Colonoscopy never Pap Smear 1-5 years ago  Allergies (Jasmine Eversole, LPN; 4/69/62959/29/2017 28:4110:25 AM) No Known Drug Allergies 07/30/2016  Medication History (Jasmine Eversole, LPN; 3/24/40109/29/2017 27:2510:25 AM) No Current Medications Medications Reconciled  Social History (Jasmine Eversole, LPN; 3/66/44039/29/2017 47:4210:44 AM) Alcohol use Occasional alcohol use. Caffeine use Coffee. No drug use Tobacco use Never smoker.  Family History Jasmine Mckenzie(Jasmine Eversole, LPN; 5/95/63879/29/2017 56:4310:44 AM) Diabetes Mellitus Father.  Pregnancy / Birth History Jasmine Mckenzie(Jasmine Eversole, LPN; 3/29/51889/29/2017 41:6610:44 AM) Age at menarche 11 years. Contraceptive History Contraceptive implant. Gravida 4 Length (months) of breastfeeding 3-6 Maternal age 34-20 Para 4 Regular periods     Review of Systems (Jasmine Eversole LPN; 0/63/01609/29/2017 10:9310:44 AM) General Not Present- Appetite Loss, Chills, Fatigue, Fever, Night Sweats, Weight Gain and Weight Loss. Skin Not Present- Change in Wart/Mole, Dryness, Hives, Jaundice, New Lesions, Non-Healing Wounds, Rash and Ulcer. HEENT Not Present- Earache, Hearing Loss, Hoarseness, Nose Bleed, Oral Ulcers, Ringing in the Ears, Seasonal Allergies, Sinus Pain, Sore Throat, Visual Disturbances, Wears glasses/contact lenses and Yellow Eyes. Respiratory Not Present- Bloody sputum, Chronic Cough, Difficulty Breathing, Snoring and Wheezing. Breast Not Present- Breast Mass, Breast Pain, Nipple Discharge and Skin Changes. Cardiovascular Present- Palpitations and Shortness of Breath. Not Present- Chest Pain, Difficulty  Breathing Lying Down, Leg Cramps, Rapid Heart Rate and Swelling of Extremities. Gastrointestinal Present- Abdominal Pain, Bloating, Constipation, Indigestion, Nausea and Vomiting. Not Present- Bloody Stool, Change in Bowel Habits, Chronic diarrhea, Difficulty Swallowing, Excessive  gas, Gets full quickly at meals, Hemorrhoids and Rectal Pain. Female Genitourinary Not Present- Frequency, Nocturia, Painful Urination, Pelvic Pain and Urgency. Musculoskeletal Present- Back Pain and Muscle Pain. Not Present- Joint Pain, Joint Stiffness, Muscle Weakness and Swelling of Extremities. Neurological Present- Decreased Memory and Headaches. Not Present- Fainting, Numbness, Seizures, Tingling, Tremor, Trouble walking and Weakness. Psychiatric Present- Change in Sleep Pattern. Not Present- Anxiety, Bipolar, Depression, Fearful and Frequent crying. Endocrine Not Present- Cold Intolerance, Excessive Hunger, Hair Changes, Heat Intolerance, Hot flashes and New Diabetes. Hematology Not Present- Blood Thinners, Easy Bruising, Excessive bleeding, Gland problems, HIV and Persistent Infections.  Vitals (Jasmine Eversole LPN; 1/61/09609/29/2017 45:4010:25 AM) 07/30/2016 10:25 AM Weight: 147.6 lb Height: 62in Body Surface Area: 1.68 m Body Mass Index: 27 kg/m  Temp.: 97.52F(Oral)  Pulse: 66 (Regular)  BP: 122/70 (Sitting, Left Arm, Standard)      Physical Exam Jasmine Mckenzie(Jasmine Kreiter K. Jasmine Heinecke MD; 08/01/2016 4:52 PM)  The physical exam findings are as follows: Note:WDWN in NAD Eyes: Pupils equal, round; sclera anicteric HENT: Oral mucosa moist; good dentition Neck: No masses palpated, no thyromegaly Lungs: CTA bilaterally; normal respiratory effort CV: Regular rate and rhythm; no murmurs; extremities well-perfused with no edema Abd: +bowel sounds, soft, tender in RUQ, no palpable organomegaly; no palpable hernias Skin: Warm, dry; no sign of jaundice Psychiatric - alert and oriented x 4; calm mood and affect    Assessment &  Plan Jasmine Mckenzie(Jasmine Jasmine Mckenzie K. Jasmine Bleecker MD; 07/30/2016 10:58 AM)  CHRONIC CHOLECYSTITIS WITH CALCULUS (K80.10)  Current Plans Schedule for Surgery - Laparoscopic cholecystectomy with intraoperative cholangiogram. The surgical procedure has been discussed with the patient. Potential risks, benefits, alternative treatments, and expected outcomes have been explained. All of the patient's questions at this time have been answered. The likelihood of reaching the patient's treatment goal is good. The patient understand the proposed surgical procedure and wishes to proceed.  Jasmine ArmsMatthew K. Corliss Skainssuei, MD, [facility]  General/ Trauma Surgery  08/01/2016 4:53 PM

## 2016-08-07 ENCOUNTER — Emergency Department (HOSPITAL_COMMUNITY): Payer: 59

## 2016-08-07 ENCOUNTER — Emergency Department (HOSPITAL_COMMUNITY)
Admission: EM | Admit: 2016-08-07 | Discharge: 2016-08-07 | Disposition: A | Discharge status: Home / Self Care | Payer: 59 | Attending: Emergency Medicine | Admitting: Emergency Medicine

## 2016-08-07 ENCOUNTER — Encounter (HOSPITAL_COMMUNITY): Payer: Self-pay | Admitting: Oncology

## 2016-08-07 DIAGNOSIS — K8020 Calculus of gallbladder without cholecystitis without obstruction: Secondary | ICD-10-CM | POA: Insufficient documentation

## 2016-08-07 DIAGNOSIS — R1010 Upper abdominal pain, unspecified: Secondary | ICD-10-CM

## 2016-08-07 DIAGNOSIS — R1011 Right upper quadrant pain: Secondary | ICD-10-CM | POA: Diagnosis present

## 2016-08-07 LAB — CBC
HEMATOCRIT: 36 % (ref 36.0–46.0)
Hemoglobin: 12.5 g/dL (ref 12.0–15.0)
MCH: 30.3 pg (ref 26.0–34.0)
MCHC: 34.7 g/dL (ref 30.0–36.0)
MCV: 87.4 fL (ref 78.0–100.0)
PLATELETS: 324 10*3/uL (ref 150–400)
RBC: 4.12 MIL/uL (ref 3.87–5.11)
RDW: 12.6 % (ref 11.5–15.5)
WBC: 8.4 10*3/uL (ref 4.0–10.5)

## 2016-08-07 LAB — URINALYSIS, ROUTINE W REFLEX MICROSCOPIC
BILIRUBIN URINE: NEGATIVE
GLUCOSE, UA: NEGATIVE mg/dL
HGB URINE DIPSTICK: NEGATIVE
KETONES UR: 15 mg/dL — AB
LEUKOCYTES UA: NEGATIVE
Nitrite: NEGATIVE
PH: 7 (ref 5.0–8.0)
PROTEIN: NEGATIVE mg/dL
Specific Gravity, Urine: 1.021 (ref 1.005–1.030)

## 2016-08-07 LAB — COMPREHENSIVE METABOLIC PANEL
ALBUMIN: 4.9 g/dL (ref 3.5–5.0)
ALT: 22 U/L (ref 14–54)
AST: 24 U/L (ref 15–41)
Alkaline Phosphatase: 49 U/L (ref 38–126)
Anion gap: 5 (ref 5–15)
BUN: 12 mg/dL (ref 6–20)
CHLORIDE: 106 mmol/L (ref 101–111)
CO2: 25 mmol/L (ref 22–32)
CREATININE: 0.76 mg/dL (ref 0.44–1.00)
Calcium: 9.3 mg/dL (ref 8.9–10.3)
GFR calc Af Amer: >60 mL/min (ref >60)
GLUCOSE: 89 mg/dL (ref 65–99)
POTASSIUM: 3.9 mmol/L (ref 3.5–5.1)
Sodium: 136 mmol/L (ref 135–145)
Total Bilirubin: 0.2 mg/dL — ABNORMAL LOW (ref 0.3–1.2)
Total Protein: 8.5 g/dL — ABNORMAL HIGH (ref 6.5–8.1)

## 2016-08-07 LAB — LIPASE, BLOOD: LIPASE: 26 U/L (ref 11–51)

## 2016-08-07 LAB — POC URINE PREG, ED: Preg Test, Ur: NEGATIVE

## 2016-08-07 MED ORDER — ONDANSETRON HCL 4 MG/2ML IJ SOLN
4.0000 mg | Freq: Once | INTRAMUSCULAR | Status: AC
  Administered 2016-08-07: 4 mg via INTRAVENOUS
  Filled 2016-08-07: qty 2

## 2016-08-07 MED ORDER — ONDANSETRON HCL 4 MG PO TABS: 4.0000 mg | ORAL_TABLET | Freq: Four times a day (QID) | ORAL | 0 refills | Status: DC

## 2016-08-07 MED ORDER — TRAMADOL HCL 50 MG PO TABS
50.0000 mg | ORAL_TABLET | Freq: Once | ORAL | Status: AC
  Administered 2016-08-07: 50 mg via ORAL
  Filled 2016-08-07: qty 1

## 2016-08-07 MED ORDER — TRAMADOL HCL 50 MG PO TABS: 50.0000 mg | ORAL_TABLET | Freq: Four times a day (QID) | ORAL | 0 refills | Status: DC | PRN

## 2016-08-07 MED ORDER — MORPHINE SULFATE (PF) 4 MG/ML IV SOLN
4.0000 mg | Freq: Once | INTRAVENOUS | Status: AC
Start: 2016-08-07 — End: 2016-08-07
  Administered 2016-08-07: 4 mg via INTRAVENOUS
  Filled 2016-08-07: qty 1

## 2016-08-07 NOTE — Discharge Instructions (Signed)
Please avoid food with fatty content.  Call surgery office on Monday to schedule close follow up.

## 2016-08-07 NOTE — ED Provider Notes (Signed)
[facility]-EMERGENCY DEPT Provider Note   CSN: [id number] Arrival date & time: 08/07/16  0416     History   Chief Complaint Chief Complaint  Patient presents with  . Abdominal Pain    HPI Jasmine Mckenzie is a 35 y.o. female.  HPI   35 year old female with history of cholelithiasis and IBS presenting with right upper quadrant abdominal pain. Patient reports she was diagnosed with IBS 4 years ago. She was also diagnosed with having gallstones past month, currently awaits appointment to have elective surgery to remove her gallbladder. For the past 3 days she report worsening abdominal pain. Pain is most significant to her right upper quadrant and she described it as a burning sensation radiates to her back with associate nausea and occasional vomiting. Pain is been persistent worsening with movement and occasionally with eating. She is having persistent nonbloody nonbilious diarrhea. She rates her pain as 8 out of 10 currently. She denies having fever, chest pain, shortness of breath, productive cough, dysuria, hematuria, vaginal bleeding, vaginal discharge, or rash. Last menstrual periods was 09/26. She has been taking ibuprofen or Goody's powder at home for her symptoms with some improvement.  Past Medical History:  Diagnosis Date  . Heart palpitations     There are no active problems to display for this patient.   Past Surgical History:  Procedure Laterality Date  . BREAST SURGERY      OB History    No data available       Home Medications    Prior to Admission medications   Medication Sig Start Date End Date Taking? Authorizing Provider  acetaminophen (TYLENOL) 500 MG tablet Take 1,000 mg by mouth every 6 (six) hours as needed for moderate pain.    Historical Provider, MD  etonogestrel (IMPLANON) 68 MG IMPL implant Inject 1 each into the skin once.    Historical Provider, MD  metroNIDAZOLE (FLAGYL) 500 MG tablet 1 pill by mouth twice per day.  Avoid any alcohol while taking  this medicine. 06/21/16   Shade Flood, MD    Family History Family History  Problem Relation Age of Onset  . Hypertension Mother   . Diabetes Father   . Hypertension Father     Social History Social History  Substance Use Topics  . Smoking status: Never Smoker  . Smokeless tobacco: Never Used  . Alcohol use No     Allergies   Review of patient's allergies indicates no known allergies.   Review of Systems Review of Systems  All other systems reviewed and are negative.    Physical Exam Updated Vital Signs BP 144/100 (BP Location: Left Arm)   Pulse 60   Temp 98 F (36.7 C) (Oral)   Resp 15   Ht 5\' 2"  (1.575 m)   Wt 65.8 kg   LMP 07/27/2016 (Approximate)   SpO2 100%   BMI 26.52 kg/m   Physical Exam  Constitutional: She appears well-developed and well-nourished. No distress.  HENT:  Head: Atraumatic.  Eyes: Conjunctivae are normal.  Neck: Neck supple.  Cardiovascular: Normal rate and regular rhythm.   Pulmonary/Chest: Effort normal and breath sounds normal. No respiratory distress. She exhibits no tenderness.  Abdominal: Soft. Bowel sounds are normal. She exhibits no distension. There is tenderness (Mild generalized abdominal tenderness. Negative Murphy sign, no pain at McBurney's point).  Neurological: She is alert.  Skin: No rash noted.  Psychiatric: She has a normal mood and affect.  Nursing note and vitals reviewed.    ED  Treatments / Results  Labs (all labs ordered are listed, but only abnormal results are displayed) Labs Reviewed  COMPREHENSIVE METABOLIC PANEL - Abnormal; Notable for the following:       Result Value   Total Protein 8.5 (*)    Total Bilirubin 0.2 (*)    All other components within normal limits  URINALYSIS, ROUTINE W REFLEX MICROSCOPIC (NOT AT [facility]) - Abnormal; Notable for the following:    Ketones, ur 15 (*)    All other components within normal limits  LIPASE, BLOOD  CBC  POC URINE PREG, ED    EKG  EKG  Interpretation None       Radiology US Abdomen Limited  Result Date: 08/07/2016 CLINICAL DATA:  Right upper quadrant pain, 3 days duration. EXAM: US ABDOMEN LIMITED - RIGHT UPPER QUADRANT COMPARISON:  07/30/2016 FINDINGS: Gallbladder: Again demonstrated is extensive chololithiasis with multiple gallstones within the gallbladder, the largest 1.4 cm. No wall thickening or appear cholecystic fluid. No Murphy sign. Common bile duct: Diameter: 3.6 mm, normal Liver: Slightly echogenic liver suggesting mild fatty change. No intrahepatic ductal dilatation or focal lesion. IMPRESSION: Re- demonstration of extensive chololithiasis. Ultrasound not able to establish the diagnosis of cholecystitis or obstruction based on the findings. Electronically Signed   By: Paulina Fusi M.D.   On: 08/07/2016 08:26    Procedures Procedures (including critical care time)  Medications Ordered in ED Medications  morphine 4 MG/ML injection 4 mg (4 mg Intravenous Given 08/07/16 0758)  ondansetron (ZOFRAN) injection 4 mg (4 mg Intravenous Given 08/07/16 0757)     Initial Impression / Assessment and Plan / ED Course  I have reviewed the triage vital signs and the nursing notes.  Pertinent labs & imaging results that were available during my care of the patient were reviewed by me and considered in my medical decision making (see chart for details).  Clinical Course    BP 139/89 (BP Location: Left Arm)   Pulse 65   Temp 98.6 F (37 C) (Oral)   Resp 16   Ht 5\' 2"  (1.575 m)   Wt 65.8 kg   LMP 07/27/2016 (Approximate)   SpO2 100%   BMI 26.52 kg/m    Final Clinical Impressions(s) / ED Diagnoses   Final diagnoses:  Pain of upper abdomen  Calculus of gallbladder without cholecystitis without obstruction    New Prescriptions New Prescriptions   ONDANSETRON (ZOFRAN) 4 MG TABLET    Take 1 tablet (4 mg total) by mouth every 6 (six) hours.   TRAMADOL (ULTRAM) 50 MG TABLET    Take 1 tablet (50 mg total) by  mouth every 6 (six) hours as needed.   7:43 AM Patient with known history of gallstones here with worsening abdominal pain. Pain appears to be mildly diffuse. Negative Murphy sign. However, I will obtain a limited ultrasound to rule out cholecystitis. Workup initiated.  9:38 AM Abdominal ultrasound showed evidence of cholelithiasis without evidence of cholecystitis. Her labs are reassuring. Normal WBC. She is not actively vomiting and has an appetite. She is afebrile with stable normal vital sign. I appreciate consultation from on-call surgeon Dr. Maisie Fus who encouraged patient to call the office on Monday to have close outpatient follow-up. Patient will be discharge with pain medication, antinausea medication, recommend avoiding fatty food, and patient understands to return to ED if her condition worsened. She is stable for discharge. QUESTIONS answered to patient's satisfaction   Fayrene Helper, PA-C 08/07/16 [id number]    Paula Libra, MD 08/07/16 2259  Paula LibraJohn Molpus, MD 08/07/16 2259

## 2016-08-07 NOTE — ED Notes (Signed)
Patient was educated not to drive, operate heavy machinery, or drink alcohol while taking narcotic medication.  

## 2016-08-07 NOTE — ED Triage Notes (Signed)
Pt w/ known hx of gallstones.  Pt c/o RUQ pain 9/10.  States she has been having this type of pain, "Forever."  Pt is A&O x 4.  +Nausea.

## 2016-08-09 ENCOUNTER — Ambulatory Visit: Payer: Self-pay | Admitting: Surgery

## 2016-08-16 ENCOUNTER — Encounter (HOSPITAL_COMMUNITY): Payer: Self-pay | Admitting: *Deleted

## 2016-08-16 NOTE — Progress Notes (Signed)
Pt denies cardiac history except for some "palpitations" years ago. Pt states she hasn't had any for quite awhile. Pt denies chest pain or sob.

## 2016-08-17 ENCOUNTER — Ambulatory Visit (HOSPITAL_COMMUNITY): Payer: 59

## 2016-08-17 ENCOUNTER — Encounter (HOSPITAL_COMMUNITY)
Admission: RE | Disposition: A | Discharge status: Home / Self Care | Payer: Self-pay | Source: Ambulatory Visit | Attending: Surgery

## 2016-08-17 ENCOUNTER — Encounter (HOSPITAL_COMMUNITY): Payer: Self-pay | Admitting: Certified Registered"

## 2016-08-17 ENCOUNTER — Ambulatory Visit (HOSPITAL_COMMUNITY): Payer: 59 | Admitting: Certified Registered"

## 2016-08-17 ENCOUNTER — Observation Stay (HOSPITAL_COMMUNITY)
Admission: RE | Admit: 2016-08-17 | Discharge: 2016-08-19 | Disposition: A | Discharge status: Home / Self Care | Payer: 59 | Source: Ambulatory Visit | Attending: Surgery | Admitting: Surgery

## 2016-08-17 DIAGNOSIS — K429 Umbilical hernia without obstruction or gangrene: Secondary | ICD-10-CM | POA: Diagnosis not present

## 2016-08-17 DIAGNOSIS — K8010 Calculus of gallbladder with chronic cholecystitis without obstruction: Principal | ICD-10-CM | POA: Diagnosis present

## 2016-08-17 DIAGNOSIS — K589 Irritable bowel syndrome without diarrhea: Secondary | ICD-10-CM | POA: Diagnosis not present

## 2016-08-17 DIAGNOSIS — M1990 Unspecified osteoarthritis, unspecified site: Secondary | ICD-10-CM | POA: Diagnosis not present

## 2016-08-17 DIAGNOSIS — Z419 Encounter for procedure for purposes other than remedying health state, unspecified: Secondary | ICD-10-CM

## 2016-08-17 DIAGNOSIS — K219 Gastro-esophageal reflux disease without esophagitis: Secondary | ICD-10-CM | POA: Diagnosis not present

## 2016-08-17 HISTORY — DX: Gastro-esophageal reflux disease without esophagitis: K21.9

## 2016-08-17 HISTORY — PX: UMBILICAL HERNIA REPAIR: SHX196

## 2016-08-17 HISTORY — DX: Unspecified osteoarthritis, unspecified site: M19.90

## 2016-08-17 HISTORY — DX: Irritable bowel syndrome, unspecified: K58.9

## 2016-08-17 HISTORY — PX: CHOLECYSTECTOMY: SHX55

## 2016-08-17 LAB — CBC
HCT: 36.6 % (ref 36.0–46.0)
HEMOGLOBIN: 12.5 g/dL (ref 12.0–15.0)
MCH: 30.2 pg (ref 26.0–34.0)
MCHC: 34.2 g/dL (ref 30.0–36.0)
MCV: 88.4 fL (ref 78.0–100.0)
Platelets: 270 10*3/uL (ref 150–400)
RBC: 4.14 MIL/uL (ref 3.87–5.11)
RDW: 12.8 % (ref 11.5–15.5)
WBC: 8.5 10*3/uL (ref 4.0–10.5)

## 2016-08-17 LAB — HCG, SERUM, QUALITATIVE: PREG SERUM: NEGATIVE

## 2016-08-17 LAB — BASIC METABOLIC PANEL
Anion gap: 7 (ref 5–15)
BUN: 10 mg/dL (ref 6–20)
CHLORIDE: 110 mmol/L (ref 101–111)
CO2: 21 mmol/L — ABNORMAL LOW (ref 22–32)
CREATININE: 0.79 mg/dL (ref 0.44–1.00)
Calcium: 8.9 mg/dL (ref 8.9–10.3)
GFR calc Af Amer: >60 mL/min (ref >60)
GFR calc non Af Amer: >60 mL/min (ref >60)
Glucose, Bld: 77 mg/dL (ref 65–99)
Potassium: 3.6 mmol/L (ref 3.5–5.1)
SODIUM: 138 mmol/L (ref 135–145)

## 2016-08-17 SURGERY — LAPAROSCOPIC CHOLECYSTECTOMY WITH INTRAOPERATIVE CHOLANGIOGRAM
Anesthesia: General | Site: Abdomen

## 2016-08-17 MED ORDER — SUGAMMADEX SODIUM 500 MG/5ML IV SOLN
INTRAVENOUS | Status: AC
  Filled 2016-08-17: qty 5

## 2016-08-17 MED ORDER — EPHEDRINE SULFATE 50 MG/ML IJ SOLN
INTRAMUSCULAR | Status: DC | PRN
  Administered 2016-08-17: 10 mg via INTRAVENOUS

## 2016-08-17 MED ORDER — FENTANYL CITRATE (PF) 100 MCG/2ML IJ SOLN
INTRAMUSCULAR | Status: DC | PRN
  Administered 2016-08-17: 50 ug via INTRAVENOUS
  Administered 2016-08-17 (×2): 100 ug via INTRAVENOUS
  Administered 2016-08-17: 50 ug via INTRAVENOUS

## 2016-08-17 MED ORDER — OXYCODONE-ACETAMINOPHEN 5-325 MG PO TABS
1.0000 | ORAL_TABLET | ORAL | Status: DC | PRN
Start: 2016-08-17 — End: 2016-08-18
  Administered 2016-08-17 – 2016-08-18 (×3): 1 via ORAL
  Filled 2016-08-17 (×3): qty 1

## 2016-08-17 MED ORDER — OXYCODONE-ACETAMINOPHEN 5-325 MG PO TABS: 1.0000 | ORAL_TABLET | ORAL | 0 refills | Status: DC | PRN

## 2016-08-17 MED ORDER — 0.9 % SODIUM CHLORIDE (POUR BTL) OPTIME
TOPICAL | Status: DC | PRN
  Administered 2016-08-17: 1000 mL

## 2016-08-17 MED ORDER — HYDROMORPHONE HCL 2 MG/ML IJ SOLN
INTRAMUSCULAR | Status: AC
  Administered 2016-08-17: 0.5 mg
  Filled 2016-08-17: qty 1

## 2016-08-17 MED ORDER — FENTANYL CITRATE (PF) 100 MCG/2ML IJ SOLN
INTRAMUSCULAR | Status: AC
  Filled 2016-08-17: qty 2

## 2016-08-17 MED ORDER — CHLORHEXIDINE GLUCONATE CLOTH 2 % EX PADS: 6.0000 | MEDICATED_PAD | Freq: Once | CUTANEOUS | Status: DC

## 2016-08-17 MED ORDER — FENTANYL CITRATE (PF) 100 MCG/2ML IJ SOLN
INTRAMUSCULAR | Status: AC
  Filled 2016-08-17: qty 4

## 2016-08-17 MED ORDER — IOPAMIDOL (ISOVUE-300) INJECTION 61%
INTRAVENOUS | Status: AC
  Filled 2016-08-17: qty 50

## 2016-08-17 MED ORDER — ROCURONIUM BROMIDE 10 MG/ML (PF) SYRINGE
PREFILLED_SYRINGE | INTRAVENOUS | Status: AC
  Filled 2016-08-17: qty 10

## 2016-08-17 MED ORDER — ENOXAPARIN SODIUM 40 MG/0.4ML ~~LOC~~ SOLN
40.0000 mg | SUBCUTANEOUS | Status: DC
  Administered 2016-08-18 – 2016-08-19 (×2): 40 mg via SUBCUTANEOUS
  Filled 2016-08-17 (×2): qty 0.4

## 2016-08-17 MED ORDER — MORPHINE SULFATE (PF) 2 MG/ML IV SOLN
2.0000 mg | INTRAVENOUS | Status: DC | PRN
  Administered 2016-08-17: 2 mg via INTRAVENOUS
  Filled 2016-08-17 (×2): qty 1

## 2016-08-17 MED ORDER — BUPIVACAINE-EPINEPHRINE 0.25% -1:200000 IJ SOLN
INTRAMUSCULAR | Status: AC
  Filled 2016-08-17: qty 1

## 2016-08-17 MED ORDER — SIMETHICONE 80 MG PO CHEW
40.0000 mg | CHEWABLE_TABLET | Freq: Four times a day (QID) | ORAL | Status: DC | PRN
  Administered 2016-08-17 – 2016-08-18 (×3): 40 mg via ORAL
  Filled 2016-08-17 (×3): qty 1

## 2016-08-17 MED ORDER — MEPERIDINE HCL 25 MG/ML IJ SOLN: 6.2500 mg | INTRAMUSCULAR | Status: DC | PRN

## 2016-08-17 MED ORDER — HYDROMORPHONE HCL 2 MG/ML IJ SOLN
0.2500 mg | INTRAMUSCULAR | Status: DC | PRN
  Administered 2016-08-17 (×4): 0.5 mg via INTRAVENOUS

## 2016-08-17 MED ORDER — SUGAMMADEX SODIUM 200 MG/2ML IV SOLN
INTRAVENOUS | Status: DC | PRN
  Administered 2016-08-17: 150 mg via INTRAVENOUS

## 2016-08-17 MED ORDER — METHOCARBAMOL 500 MG PO TABS
500.0000 mg | ORAL_TABLET | Freq: Four times a day (QID) | ORAL | Status: DC | PRN
  Administered 2016-08-17 – 2016-08-18 (×3): 500 mg via ORAL
  Filled 2016-08-17 (×4): qty 1

## 2016-08-17 MED ORDER — BUPIVACAINE-EPINEPHRINE 0.25% -1:200000 IJ SOLN
INTRAMUSCULAR | Status: DC | PRN
  Administered 2016-08-17: 50 mL

## 2016-08-17 MED ORDER — MIDAZOLAM HCL 2 MG/2ML IJ SOLN
INTRAMUSCULAR | Status: AC
  Filled 2016-08-17: qty 2

## 2016-08-17 MED ORDER — METOCLOPRAMIDE HCL 5 MG/ML IJ SOLN: 10.0000 mg | Freq: Once | INTRAMUSCULAR | Status: DC | PRN

## 2016-08-17 MED ORDER — SODIUM CHLORIDE 0.9 % IV SOLN
INTRAVENOUS | Status: DC | PRN
  Administered 2016-08-17: 100 mL

## 2016-08-17 MED ORDER — LIDOCAINE 2% (20 MG/ML) 5 ML SYRINGE
INTRAMUSCULAR | Status: AC
  Filled 2016-08-17: qty 5

## 2016-08-17 MED ORDER — SODIUM CHLORIDE 0.9 % IR SOLN
Status: DC | PRN
  Administered 2016-08-17: 1000 mL

## 2016-08-17 MED ORDER — CEFAZOLIN SODIUM-DEXTROSE 2-4 GM/100ML-% IV SOLN
2.0000 g | INTRAVENOUS | Status: AC
  Administered 2016-08-17: 2 g via INTRAVENOUS
  Filled 2016-08-17: qty 100

## 2016-08-17 MED ORDER — PHENYLEPHRINE HCL 10 MG/ML IJ SOLN
INTRAMUSCULAR | Status: DC | PRN
  Administered 2016-08-17: 40 ug via INTRAVENOUS

## 2016-08-17 MED ORDER — ZOLPIDEM TARTRATE 5 MG PO TABS
5.0000 mg | ORAL_TABLET | Freq: Every evening | ORAL | Status: DC | PRN
  Administered 2016-08-17: 5 mg via ORAL
  Filled 2016-08-17: qty 1

## 2016-08-17 MED ORDER — PROPOFOL 10 MG/ML IV BOLUS
INTRAVENOUS | Status: AC
  Filled 2016-08-17: qty 20

## 2016-08-17 MED ORDER — ROCURONIUM BROMIDE 100 MG/10ML IV SOLN
INTRAVENOUS | Status: DC | PRN
  Administered 2016-08-17: 40 mg via INTRAVENOUS
  Administered 2016-08-17: 10 mg via INTRAVENOUS

## 2016-08-17 MED ORDER — ONDANSETRON HCL 4 MG/2ML IJ SOLN
4.0000 mg | INTRAMUSCULAR | Status: DC | PRN
  Administered 2016-08-17 (×2): 4 mg via INTRAVENOUS
  Filled 2016-08-17 (×5): qty 2

## 2016-08-17 MED ORDER — ONDANSETRON HCL 4 MG/2ML IJ SOLN
INTRAMUSCULAR | Status: DC | PRN
  Administered 2016-08-17: 4 mg via INTRAVENOUS

## 2016-08-17 MED ORDER — MIDAZOLAM HCL 5 MG/5ML IJ SOLN
INTRAMUSCULAR | Status: DC | PRN
  Administered 2016-08-17: 2 mg via INTRAVENOUS

## 2016-08-17 MED ORDER — LACTATED RINGERS IV SOLN
INTRAVENOUS | Status: DC
  Administered 2016-08-17 (×2): via INTRAVENOUS

## 2016-08-17 MED ORDER — PROPOFOL 10 MG/ML IV BOLUS
INTRAVENOUS | Status: DC | PRN
  Administered 2016-08-17: 180 mg via INTRAVENOUS

## 2016-08-17 MED ORDER — LIDOCAINE HCL (CARDIAC) 20 MG/ML IV SOLN
INTRAVENOUS | Status: DC | PRN
  Administered 2016-08-17: 60 mg via INTRAVENOUS

## 2016-08-17 SURGICAL SUPPLY — 61 items
APPLIER CLIP ROT 10 11.4 M/L (STAPLE) ×2
BENZOIN TINCTURE PRP APPL 2/3 (GAUZE/BANDAGES/DRESSINGS) ×2 IMPLANT
BLADE SURG 15 STRL LF DISP TIS (BLADE) ×1 IMPLANT
BLADE SURG 15 STRL SS (BLADE) ×1
BLADE SURG ROTATE 9660 (MISCELLANEOUS) IMPLANT
CANISTER SUCTION 2500CC (MISCELLANEOUS) ×2 IMPLANT
CHLORAPREP W/TINT 26ML (MISCELLANEOUS) ×2 IMPLANT
CLIP APPLIE ROT 10 11.4 M/L (STAPLE) ×1 IMPLANT
COVER MAYO STAND STRL (DRAPES) ×2 IMPLANT
COVER SURGICAL LIGHT HANDLE (MISCELLANEOUS) ×2 IMPLANT
DRAPE C-ARM 42X72 X-RAY (DRAPES) ×2 IMPLANT
DRAPE LAPAROTOMY 100X72 PEDS (DRAPES) ×2 IMPLANT
DRAPE UTILITY XL STRL (DRAPES) ×2 IMPLANT
DRSG TEGADERM 2-3/8X2-3/4 SM (GAUZE/BANDAGES/DRESSINGS) ×2 IMPLANT
DRSG TEGADERM 4X4.75 (GAUZE/BANDAGES/DRESSINGS) ×2 IMPLANT
ELECT CAUTERY BLADE 6.4 (BLADE) ×2 IMPLANT
ELECT REM PT RETURN 9FT ADLT (ELECTROSURGICAL) ×2
ELECTRODE REM PT RTRN 9FT ADLT (ELECTROSURGICAL) ×1 IMPLANT
FILTER SMOKE EVAC LAPAROSHD (FILTER) ×2 IMPLANT
GAUZE SPONGE 2X2 8PLY STRL LF (GAUZE/BANDAGES/DRESSINGS) ×1 IMPLANT
GAUZE SPONGE 4X4 16PLY XRAY LF (GAUZE/BANDAGES/DRESSINGS) ×2 IMPLANT
GLOVE BIO SURGEON STRL SZ7 (GLOVE) ×2 IMPLANT
GLOVE BIOGEL PI IND STRL 7.0 (GLOVE) ×1 IMPLANT
GLOVE BIOGEL PI IND STRL 7.5 (GLOVE) ×3 IMPLANT
GLOVE BIOGEL PI INDICATOR 7.0 (GLOVE) ×1
GLOVE BIOGEL PI INDICATOR 7.5 (GLOVE) ×3
GLOVE ECLIPSE 7.5 STRL STRAW (GLOVE) ×2 IMPLANT
GLOVE SURG SS PI 6.5 STRL IVOR (GLOVE) ×2 IMPLANT
GOWN STRL REUS W/ TWL LRG LVL3 (GOWN DISPOSABLE) ×3 IMPLANT
GOWN STRL REUS W/TWL LRG LVL3 (GOWN DISPOSABLE) ×3
KIT BASIN OR (CUSTOM PROCEDURE TRAY) ×2 IMPLANT
KIT ROOM TURNOVER OR (KITS) ×2 IMPLANT
NEEDLE HYPO 25GX1X1/2 BEV (NEEDLE) ×2 IMPLANT
NS IRRIG 1000ML POUR BTL (IV SOLUTION) ×2 IMPLANT
PACK SURGICAL SETUP 50X90 (CUSTOM PROCEDURE TRAY) ×2 IMPLANT
PAD ARMBOARD 7.5X6 YLW CONV (MISCELLANEOUS) ×2 IMPLANT
PENCIL BUTTON HOLSTER BLD 10FT (ELECTRODE) ×2 IMPLANT
POUCH SPECIMEN RETRIEVAL 10MM (ENDOMECHANICALS) ×2 IMPLANT
SCISSORS LAP 5X35 DISP (ENDOMECHANICALS) ×2 IMPLANT
SET CHOLANGIOGRAPH 5 50 .035 (SET/KITS/TRAYS/PACK) ×2 IMPLANT
SET IRRIG TUBING LAPAROSCOPIC (IRRIGATION / IRRIGATOR) ×2 IMPLANT
SLEEVE ENDOPATH XCEL 5M (ENDOMECHANICALS) ×2 IMPLANT
SPECIMEN JAR SMALL (MISCELLANEOUS) ×2 IMPLANT
SPONGE GAUZE 2X2 STER 10/PKG (GAUZE/BANDAGES/DRESSINGS) ×1
STRIP CLOSURE SKIN 1/2X4 (GAUZE/BANDAGES/DRESSINGS) ×2 IMPLANT
SUT MNCRL AB 4-0 PS2 18 (SUTURE) ×2 IMPLANT
SUT NOVA NAB DX-16 0-1 5-0 T12 (SUTURE) ×2 IMPLANT
SUT NOVA NAB GS-21 0 18 T12 DT (SUTURE) ×4 IMPLANT
SUT VIC AB 3-0 SH 27 (SUTURE) ×2
SUT VIC AB 3-0 SH 27X BRD (SUTURE) ×2 IMPLANT
SYR BULB 3OZ (MISCELLANEOUS) ×2 IMPLANT
SYR CONTROL 10ML LL (SYRINGE) ×2 IMPLANT
TOWEL OR 17X24 6PK STRL BLUE (TOWEL DISPOSABLE) ×2 IMPLANT
TOWEL OR 17X26 10 PK STRL BLUE (TOWEL DISPOSABLE) IMPLANT
TRAY LAPAROSCOPIC MC (CUSTOM PROCEDURE TRAY) ×2 IMPLANT
TROCAR XCEL BLUNT TIP 100MML (ENDOMECHANICALS) ×2 IMPLANT
TROCAR XCEL NON-BLD 11X100MML (ENDOMECHANICALS) ×2 IMPLANT
TROCAR XCEL NON-BLD 5MMX100MML (ENDOMECHANICALS) ×2 IMPLANT
TUBE CONNECTING 12X1/4 (SUCTIONS) IMPLANT
TUBING INSUFFLATION (TUBING) ×2 IMPLANT
YANKAUER SUCT BULB TIP NO VENT (SUCTIONS) IMPLANT

## 2016-08-17 NOTE — Interval H&P Note (Signed)
History and Physical Interval Note:  08/17/2016 12:48 PM  Jasmine ShowersLinda Ann Rush  has presented today for surgery, with the diagnosis of CHRONIC CALCULUS CHOLECYSTITIS, UMBILICAL HERNIA  The various methods of treatment have been discussed with the patient and family. After consideration of risks, benefits and other options for treatment, the patient has consented to  Procedure(s): LAPAROSCOPIC CHOLECYSTECTOMY WITH INTRAOPERATIVE CHOLANGIOGRAM (N/A) HERNIA REPAIR UMBILICAL ADULT (N/A) as a surgical intervention .  The patient's history has been reviewed, patient examined, no change in status, stable for surgery.  I have reviewed the patient's chart and labs.  Questions were answered to the patient's satisfaction.     Rafael Salway K.

## 2016-08-17 NOTE — Anesthesia Procedure Notes (Signed)
Procedure Name: Intubation Date/Time: 08/17/2016 1:42 PM Performed by: Lucinda DellECARLO, Flavia Bruss M Pre-anesthesia Checklist: Patient identified, Emergency Drugs available, Suction available, Patient being monitored and Timeout performed Patient Re-evaluated:Patient Re-evaluated prior to inductionOxygen Delivery Method: Circle system utilized Preoxygenation: Pre-oxygenation with 100% oxygen Intubation Type: IV induction Ventilation: Mask ventilation without difficulty Laryngoscope Size: Mac and 3 Grade View: Grade I Tube type: Oral Tube size: 7.0 mm Number of attempts: 1 Airway Equipment and Method: Stylet Placement Confirmation: ETT inserted through vocal cords under direct vision,  CO2 detector,  positive ETCO2 and breath sounds checked- equal and bilateral Secured at: 22 cm Tube secured with: Tape Dental Injury: Teeth and Oropharynx as per pre-operative assessment  Comments: Intubated by Fanny BienSRNA Lidya Melesse

## 2016-08-17 NOTE — Discharge Instructions (Signed)
[facility] ______[facility] °LAPAROSCOPIC SURGERY: POST OP INSTRUCTIONS °Always review your discharge instruction sheet given to you by the facility where your surgery was performed. °IF YOU HAVE DISABILITY OR FAMILY LEAVE FORMS, YOU MUST BRING THEM TO THE OFFICE FOR PROCESSING.   °DO NOT GIVE THEM TO YOUR DOCTOR. ° °1. A prescription for pain medication may be given to you upon discharge.  Take your pain medication as prescribed, if needed.  If narcotic pain medicine is not needed, then you may take acetaminophen (Tylenol) or ibuprofen (Advil) as needed. °2. Take your usually prescribed medications unless otherwise directed. °3. If you need a refill on your pain medication, please contact your pharmacy.  They will contact our office to request authorization. Prescriptions will not be filled after 5pm or on week-ends. °4. You should follow a light diet the first few days after arrival home, such as soup and crackers, etc.  Be sure to include lots of fluids daily. °5. Most patients will experience some swelling and bruising in the area of the incisions.  Ice packs will help.  Swelling and bruising can take several days to resolve.  °6. It is common to experience some constipation if taking pain medication after surgery.  Increasing fluid intake and taking a stool softener (such as Colace) will usually help or prevent this problem from occurring.  A mild laxative (Milk of Magnesia or Miralax) should be taken according to package instructions if there are no bowel movements after 48 hours. °7. Unless discharge instructions indicate otherwise, you may remove your bandages 24-48 hours after surgery, and you may shower at that time.  You may have steri-strips (small skin tapes) in place directly over the incision.  These strips should be left on the skin for 7-10 days.  If your surgeon used skin glue on the incision, you may shower in 24 hours.  The glue will flake off over the next 2-3 weeks.  Any sutures or  staples will be removed at the office during your follow-up visit. °8. ACTIVITIES:  You may resume regular (light) daily activities beginning the next day--such as daily self-care, walking, climbing stairs--gradually increasing activities as tolerated.  You may have sexual intercourse when it is comfortable.  Refrain from any heavy lifting or straining until approved by your doctor. °a. You may drive when you are no longer taking prescription pain medication, you can comfortably wear a seatbelt, and you can safely maneuver your car and apply brakes. °b. RETURN TO WORK:  __________________________________________________________ °9. You should see your doctor in the office for a follow-up appointment approximately 2-3 weeks after your surgery.  Make sure that you call for this appointment within a day or two after you arrive home to insure a convenient appointment time. °10. OTHER INSTRUCTIONS: __________________________________________________________________________________________________________________________ __________________________________________________________________________________________________________________________ °WHEN TO CALL YOUR DOCTOR: °1. Fever over 101.0 °2. Inability to urinate °3. Continued bleeding from incision. °4. Increased pain, redness, or drainage from the incision. °5. Increasing abdominal pain ° °The clinic staff is available to answer your questions during regular business hours.  Please don’t hesitate to call and ask to speak to one of the nurses for clinical concerns.  If you have a medical emergency, go to the nearest emergency room or call 911.  A surgeon from [facility] is always on call at the hospital. °[address], [address], [place], [place]  [address] ? [address] [address], [place], [place]   [address] °[phone] ? [phone] ? FAX [phone] °Web site:   [email or web address] °

## 2016-08-17 NOTE — Op Note (Signed)
Laparoscopic Cholecystectomy with IOC Procedure Note  Indications: This patient presents with symptomatic gallbladder disease and will undergo laparoscopic cholecystectomy.  She also has a small umbilical hernia that is completely asymptomatic.  Pre-operative Diagnosis: Calculus of gallbladder with other cholecystitis, without mention of obstruction  Post-operative Diagnosis: Same  Surgeon: Shontae Rosiles K.   Assistants: none  Anesthesia: General endotracheal anesthesia  ASA Class: 1  Procedure Details  The patient was seen again in the Holding Room. The risks, benefits, complications, treatment options, and expected outcomes were discussed with the patient. The possibilities of reaction to medication, pulmonary aspiration, perforation of viscus, bleeding, recurrent infection, finding a normal gallbladder, the need for additional procedures, failure to diagnose a condition, the possible need to convert to an open procedure, and creating a complication requiring transfusion or operation were discussed with the patient. The likelihood of improving the patient's symptoms with return to their baseline status is good.  The patient and/or family concurred with the proposed plan, giving informed consent. The site of surgery properly noted. The patient was taken to Operating Room, identified as Jasmine Mckenzie and the procedure verified as Laparoscopic Cholecystectomy with Intraoperative Cholangiogram. A Time Out was held and the above information confirmed.  Prior to the induction of general anesthesia, antibiotic prophylaxis was administered. General endotracheal anesthesia was then administered and tolerated well. After the induction, the abdomen was prepped with Chloraprep and draped in the sterile fashion. The patient was positioned in the supine position.  Local anesthetic agent was injected into the skin near the umbilicus and an incision made. We dissected down to the abdominal fascia with blunt  dissection.  She has a very small umbilical hernia and we entered the sac.  The fascia was incised vertically to enlarge the fascial defect and we entered the peritoneal cavity bluntly.  A pursestring suture of 0-Vicryl was placed around the fascial opening.  The Hasson cannula was inserted and secured with the stay suture.  Pneumoperitoneum was then created with CO2 and tolerated well without any adverse changes in the patient's vital signs. An 11-mm port was placed in the subxiphoid position.  Two 5-mm ports were placed in the right upper quadrant. All skin incisions were infiltrated with a local anesthetic agent before making the incision and placing the trocars.   We positioned the patient in reverse Trendelenburg, tilted slightly to the patient's left.  The gallbladder was identified, the fundus grasped and retracted cephalad. Adhesions were lysed bluntly and with the electrocautery where indicated, taking care not to injure any adjacent organs or viscus. The infundibulum was grasped and retracted laterally, exposing the peritoneum overlying the triangle of Calot. This was then divided and exposed in a blunt fashion. A critical view of the cystic duct and cystic artery was obtained.  The cystic duct was clearly identified and bluntly dissected circumferentially. The cystic duct was ligated with a clip distally.   An incision was made in the cystic duct and the [facility] cholangiogram catheter introduced. The catheter was secured using a clip. A cholangiogram was then obtained which showed good visualization of the distal and proximal biliary tree with no sign of filling defects or obstruction.  Contrast flowed easily into the duodenum. The catheter was then removed.   The cystic duct was then ligated with clips and divided. The cystic artery was identified, dissected free, ligated with clips and divided as well.   The gallbladder was dissected from the liver bed in retrograde fashion with the electrocautery.   Some bile  was spilled, but no stones were noted outside the gallbladder.  The gallbladder was removed and placed in an Endocatch sac. The liver bed was irrigated and inspected. Hemostasis was achieved with the electrocautery. Copious irrigation was utilized and was repeatedly aspirated until clear.  The gallbladder and Endocatch sac were then removed through the umbilical port site.  The pursestring suture was removed.  We closed the umbilical fascia with interrupted 0 Novofil sutures.  3-0 Vicryl was used to close the subcutaneous tissues at the umbilicus.    We again inspected the right upper quadrant for hemostasis.  Pneumoperitoneum was released as we removed the trocars.  4-0 Monocryl was used to close the skin.   Benzoin, steri-strips, and clean dressings were applied. The patient was then extubated and brought to the recovery room in stable condition. Instrument, sponge, and needle counts were correct at closure and at the conclusion of the case.   Findings: Cholecystitis with Cholelithiasis  Estimated Blood Loss: Minimal         Drains: none         Specimens: Gallbladder           Complications: None; patient tolerated the procedure well.         Disposition: PACU - hemodynamically stable.         Condition: stable  Wilmon Arms. Corliss Skains, MD, [facility]  General/ Trauma Surgery  08/17/2016 2:39 PM

## 2016-08-17 NOTE — Anesthesia Postprocedure Evaluation (Signed)
Anesthesia Post Note  Patient: Jasmine ShowersLinda Ann Mckenzie  Procedure(s) Performed: Procedure(s) (LRB): LAPAROSCOPIC CHOLECYSTECTOMY WITH INTRAOPERATIVE CHOLANGIOGRAM (N/A) HERNIA REPAIR UMBILICAL ADULT (N/A)  Patient location during evaluation: PACU Anesthesia Type: General Level of consciousness: awake and alert and oriented Pain management: pain level controlled Vital Signs Assessment: post-procedure vital signs reviewed and stable Cardiovascular status: blood pressure returned to baseline and stable Postop Assessment: no signs of nausea or vomiting Anesthetic complications: no    Last Vitals:  Vitals:   08/17/16 1521 08/17/16 1530  BP:  126/85  Pulse: 77 71  Resp: 14 10  Temp:      Last Pain:  Vitals:   08/17/16 1521  TempSrc:   PainSc: 9                  Norelle Runnion A.

## 2016-08-17 NOTE — H&P (View-Only) (Signed)
History of Present Illness Jasmine Mckenzie(Jasmine Lemen K. Arcenio Mullaly MD; 08/01/2016 4:51 PM) Patient words: GB.  The patient is a 35 year old female who presents for evaluation of gall stones. Referred by Boneta LucksJennifer Brown, NP for symptomatic gallstones  This is a 35 yo female who recently underwent a MRI for back issues. The scan showed an incidental finding of cholelithiasis. The patient has a long history of irritable bowel syndrome, including epigastric pain, nausea, vomiting, and bloating. This has been going on for almost 15 years, but has worsened over the last few years. She has had significant issues with constipation and is currently on Linzess with minimal improvement.   Addendum: In preparation for surgery, I obtained an ultrasound to confirm the MRI findings.  CLINICAL DATA: Right upper quadrant pain intermittently for several years  EXAM: ABDOMEN ULTRASOUND COMPLETE  COMPARISON: None in PACs  FINDINGS: Gallbladder: The gallbladder is adequately distended. There are multiple echogenic mobile shadowing stones. The largest measures 1.4 cm in diameter. There is no gallbladder wall thickening, pericholecystic fluid, or positive sonographic Murphy's sign.  Common bile duct: Diameter: 3.4 mm  Liver: The hepatic echotexture is mildly increased diffusely. There is no focal mass nor ductal dilation.  IVC: No abnormality visualized.  Pancreas: The pancreatic head and body are grossly normal. The pancreatic tail was partially obscured by bowel gas.  Spleen: Size and appearance within normal limits.  Right Kidney: Length: 11.8 cm. Echogenicity within normal limits. No mass or hydronephrosis visualized.  Left Kidney: Length: 11.3 cm. Echogenicity within normal limits. No mass or hydronephrosis visualized.  Abdominal aorta: No aneurysm visualized.  Other findings: There is no ascites.  IMPRESSION: Gallstones without sonographic evidence of acute cholecystitis.  Increased hepatic  echotexture consistent with fatty infiltration.   Electronically Signed By: Jasmine SwazilandJordan M.D. On: 07/30/2016 15:23     Other Problems (Jasmine Eversole, Jasmine Mckenzie; 1/61/09609/29/2017 45:4010:44 AM) Back Pain Cholelithiasis  Past Surgical History (Jasmine Eversole, Jasmine Mckenzie; 9/81/19149/29/2017 78:2910:44 AM) Breast Augmentation Bilateral. Foot Surgery Right.  Diagnostic Studies History (Jasmine Eversole, Jasmine Mckenzie; 5/62/13089/29/2017 65:7810:44 AM) Colonoscopy never Pap Smear 1-5 years ago  Allergies (Jasmine Eversole, Jasmine Mckenzie; 4/69/62959/29/2017 28:4110:25 AM) No Known Drug Allergies 07/30/2016  Medication History (Jasmine Eversole, Jasmine Mckenzie; 3/24/40109/29/2017 27:2510:25 AM) No Current Medications Medications Reconciled  Social History (Jasmine Eversole, Jasmine Mckenzie; 3/66/44039/29/2017 47:4210:44 AM) Alcohol use Occasional alcohol use. Caffeine use Coffee. No drug use Tobacco use Never smoker.  Family History Jasmine Mckenzie(Jasmine Eversole, Jasmine Mckenzie; 5/95/63879/29/2017 56:4310:44 AM) Diabetes Mellitus Father.  Pregnancy / Birth History Jasmine Mckenzie(Jasmine Eversole, Jasmine Mckenzie; 3/29/51889/29/2017 41:6610:44 AM) Age at menarche 11 years. Contraceptive History Contraceptive implant. Gravida 4 Length (months) of breastfeeding 3-6 Maternal age 34-20 Para 4 Regular periods     Review of Systems (Jasmine Eversole Jasmine Mckenzie; 0/63/01609/29/2017 10:9310:44 AM) General Not Present- Appetite Loss, Chills, Fatigue, Fever, Night Sweats, Weight Gain and Weight Loss. Skin Not Present- Change in Wart/Mole, Dryness, Hives, Jaundice, New Lesions, Non-Healing Wounds, Rash and Ulcer. HEENT Not Present- Earache, Hearing Loss, Hoarseness, Nose Bleed, Oral Ulcers, Ringing in the Ears, Seasonal Allergies, Sinus Pain, Sore Throat, Visual Disturbances, Wears glasses/contact lenses and Yellow Eyes. Respiratory Not Present- Bloody sputum, Chronic Cough, Difficulty Breathing, Snoring and Wheezing. Breast Not Present- Breast Mass, Breast Pain, Nipple Discharge and Skin Changes. Cardiovascular Present- Palpitations and Shortness of Breath. Not Present- Chest Pain, Difficulty  Breathing Lying Down, Leg Cramps, Rapid Heart Rate and Swelling of Extremities. Gastrointestinal Present- Abdominal Pain, Bloating, Constipation, Indigestion, Nausea and Vomiting. Not Present- Bloody Stool, Change in Bowel Habits, Chronic diarrhea, Difficulty Swallowing, Excessive  gas, Gets full quickly at meals, Hemorrhoids and Rectal Pain. Female Genitourinary Not Present- Frequency, Nocturia, Painful Urination, Pelvic Pain and Urgency. Musculoskeletal Present- Back Pain and Muscle Pain. Not Present- Joint Pain, Joint Stiffness, Muscle Weakness and Swelling of Extremities. Neurological Present- Decreased Memory and Headaches. Not Present- Fainting, Numbness, Seizures, Tingling, Tremor, Trouble walking and Weakness. Psychiatric Present- Change in Sleep Pattern. Not Present- Anxiety, Bipolar, Depression, Fearful and Frequent crying. Endocrine Not Present- Cold Intolerance, Excessive Hunger, Hair Changes, Heat Intolerance, Hot flashes and New Diabetes. Hematology Not Present- Blood Thinners, Easy Bruising, Excessive bleeding, Gland problems, HIV and Persistent Infections.  Vitals (Jasmine Eversole Jasmine Mckenzie; 1/61/09609/29/2017 45:4010:25 AM) 07/30/2016 10:25 AM Weight: 147.6 lb Height: 62in Body Surface Area: 1.68 m Body Mass Index: 27 kg/m  Temp.: 97.52F(Oral)  Pulse: 66 (Regular)  BP: 122/70 (Sitting, Left Arm, Standard)      Physical Exam Jasmine Mckenzie(Jasmine Cournoyer K. Deep Bonawitz MD; 08/01/2016 4:52 PM)  The physical exam findings are as follows: Note:WDWN in NAD Eyes: Pupils equal, round; sclera anicteric HENT: Oral mucosa moist; good dentition Neck: No masses palpated, no thyromegaly Lungs: CTA bilaterally; normal respiratory effort CV: Regular rate and rhythm; no murmurs; extremities well-perfused with no edema Abd: +bowel sounds, soft, tender in RUQ, no palpable organomegaly; no palpable hernias Skin: Warm, dry; no sign of jaundice Psychiatric - alert and oriented x 4; calm mood and affect    Assessment &  Plan Jasmine Mckenzie(Jasmine Sunga K. 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 The surgical procedure has been discussed with the patient. Potential risks, benefits, alternative treatments, and expected outcomes have been explained. All of the patient's questions at this time have been answered. The likelihood of reaching the patient's treatment goal is good. The patient understand the proposed surgical procedure and wishes to proceed.  Jasmine ArmsMatthew K. Corliss Skainssuei, MD, [facility]  General/ Trauma Surgery  08/01/2016 4:53 PM

## 2016-08-17 NOTE — Transfer of Care (Signed)
Immediate Anesthesia Transfer of Care Note  Patient: Jasmine ShowersLinda Ann Mckenzie  Procedure(s) Performed: Procedure(s): LAPAROSCOPIC CHOLECYSTECTOMY WITH INTRAOPERATIVE CHOLANGIOGRAM (N/A) HERNIA REPAIR UMBILICAL ADULT (N/A)  Patient Location: PACU  Anesthesia Type:General  Level of Consciousness: awake, alert , oriented and patient cooperative  Airway & Oxygen Therapy: Patient Spontanous Breathing and Patient connected to nasal cannula oxygen  Post-op Assessment: Report given to RN, Post -op Vital signs reviewed and stable and Patient moving all extremities  Post vital signs: Reviewed and stable  Last Vitals:  Vitals:   08/17/16 1106 08/17/16 1457  BP: 129/88   Pulse: 76   Resp: 16   Temp: 37.3 C (P) 36.7 C    Last Pain:  Vitals:   08/17/16 1106  TempSrc: Oral         Complications: No apparent anesthesia complications

## 2016-08-17 NOTE — Anesthesia Preprocedure Evaluation (Addendum)
Anesthesia Evaluation  Patient identified by MRN, date of birth, ID band Patient awake    Reviewed: Allergy & Precautions, NPO status , Patient's Chart, lab work & pertinent test results  Airway Mallampati: II  TM Distance: >3 FB Neck ROM: Full    Dental no notable dental hx. (+) Teeth Intact   Pulmonary neg pulmonary ROS,    Pulmonary exam normal breath sounds clear to auscultation       Cardiovascular negative cardio ROS Normal cardiovascular exam Rhythm:Regular Rate:Normal     Neuro/Psych negative neurological ROS  negative psych ROS   GI/Hepatic Neg liver ROS, GERD  Medicated and Controlled,Chronic cholecystitis with cholelithiasis IBS   Endo/Other  negative endocrine ROS  Renal/GU negative Renal ROS  negative genitourinary   Musculoskeletal  (+) Arthritis ,   Abdominal   Peds  Hematology negative hematology ROS (+)   Anesthesia Other Findings   Reproductive/Obstetrics                            Lab Results  Component Value Date   WBC 8.4 08/07/2016   HGB 12.5 08/07/2016   HCT 36.0 08/07/2016   MCV 87.4 08/07/2016   PLT 324 08/07/2016     Chemistry      Component Value Date/Time   NA 136 08/07/2016 0747   K 3.9 08/07/2016 0747   CL 106 08/07/2016 0747   CO2 25 08/07/2016 0747   BUN 12 08/07/2016 0747   CREATININE 0.76 08/07/2016 0747      Component Value Date/Time   CALCIUM 9.3 08/07/2016 0747   ALKPHOS 49 08/07/2016 0747   AST 24 08/07/2016 0747   ALT 22 08/07/2016 0747   BILITOT 0.2 (L) 08/07/2016 0747      Anesthesia Physical Anesthesia Plan  ASA: II  Anesthesia Plan: General   Post-op Pain Management:    Induction: Intravenous  Airway Management Planned: Oral ETT  Additional Equipment:   Intra-op Plan:   Post-operative Plan: Extubation in OR  Informed Consent: I have reviewed the patients History and Physical, chart, labs and discussed the  procedure including the risks, benefits and alternatives for the proposed anesthesia with the patient or authorized representative who has indicated his/her understanding and acceptance.   Dental advisory given  Plan Discussed with: CRNA, Anesthesiologist and Surgeon  Anesthesia Plan Comments:        Anesthesia Quick Evaluation

## 2016-08-18 ENCOUNTER — Encounter (HOSPITAL_COMMUNITY): Payer: Self-pay | Admitting: Surgery

## 2016-08-18 DIAGNOSIS — K8010 Calculus of gallbladder with chronic cholecystitis without obstruction: Secondary | ICD-10-CM | POA: Diagnosis not present

## 2016-08-18 MED ORDER — KETOROLAC TROMETHAMINE 30 MG/ML IJ SOLN
30.0000 mg | Freq: Four times a day (QID) | INTRAMUSCULAR | Status: DC
  Administered 2016-08-18 – 2016-08-19 (×5): 30 mg via INTRAVENOUS
  Filled 2016-08-18 (×5): qty 1

## 2016-08-18 MED ORDER — OXYCODONE-ACETAMINOPHEN 5-325 MG PO TABS
1.0000 | ORAL_TABLET | ORAL | Status: DC | PRN
  Administered 2016-08-18 – 2016-08-19 (×6): 2 via ORAL
  Filled 2016-08-18 (×6): qty 2

## 2016-08-18 MED ORDER — ALBUTEROL SULFATE (2.5 MG/3ML) 0.083% IN NEBU
3.0000 mL | INHALATION_SOLUTION | Freq: Four times a day (QID) | RESPIRATORY_TRACT | Status: DC | PRN
Start: 2016-08-18 — End: 2016-08-19

## 2016-08-18 NOTE — Progress Notes (Signed)
Pt reported having some chest pain with abdominal pain. Surgery on call was paged and he advised to give pain med and Mylicon for gas and continue to monitor. Will continue to monitor.

## 2016-08-18 NOTE — Progress Notes (Signed)
Pt with complaint of SOB and difficulty breathing. Paged General Surgery for patient. Pt to get 2 puffs of an albuterol inhaler q6 prn. Pt practicing incentive spirometer also.

## 2016-08-18 NOTE — Progress Notes (Signed)
1 Day Post-Op  Subjective: Patient having nausea, issues with shoulder/ chest pain Did not rest much last night  Objective: Vital signs in last 24 hours: Temp:  [97 F (36.1 C)-99.2 F (37.3 C)] 98.2 F (36.8 C) (10/18 0437) Pulse Rate:  [61-77] 66 (10/18 0437) Resp:  [10-22] 20 (10/18 0437) BP: (112-129)/(65-88) 120/65 (10/18 0437) SpO2:  [99 %-100 %] 100 % (10/18 0437) Weight:  [66.2 kg (146 lb)-71.3 kg (157 lb 3.2 oz)] 71.3 kg (157 lb 3.2 oz) (10/17 1805) Last BM Date: 08/17/16  Intake/Output from previous day: 10/17 0701 - 10/18 0700 In: 1380 [I.V.:1380] Out: 20 [Blood:20] Intake/Output this shift: No intake/output data recorded.  General appearance: alert, cooperative and no distress GI: soft, incisional tenderness; no peritonitis Incisions c/d/i  Lab Results:   Recent Labs  08/17/16 1201  WBC 8.5  HGB 12.5  HCT 36.6  PLT 270   BMET  Recent Labs  08/17/16 1201  NA 138  K 3.6  CL 110  CO2 21*  GLUCOSE 77  BUN 10  CREATININE 0.79  CALCIUM 8.9   PT/INR No results for input(s): LABPROT, INR in the last 72 hours. ABG No results for input(s): PHART, HCO3 in the last 72 hours.  Invalid input(s): PCO2, PO2  Studies/Results: Dg Cholangiogram Operative  Result Date: 08/17/2016 CLINICAL DATA:  Intraoperative cholangiogram during laparoscopic cholecystectomy. EXAM: INTRAOPERATIVE CHOLANGIOGRAM FLUOROSCOPY TIME:  9 seconds COMPARISON:  Right upper quadrant abdominal ultrasound - 08/07/2016 FINDINGS: Intraoperative cholangiographic images of the right upper abdominal quadrant during laparoscopic cholecystectomy are provided for review. Surgical clips overlie the expected location of the gallbladder fossa. Contrast injection demonstrates selective cannulation of the central aspect of the cystic duct. There is passage of contrast through the central aspect of the cystic duct with filling of a non dilated common bile duct. There is passage of contrast though the  CBD and into the descending portion of the duodenum. There is minimal reflux of injected contrast into the common hepatic duct and central aspect of the non dilated intrahepatic biliary system. There are no discrete filling defects within the opacified portions of the biliary system to suggest the presence of choledocholithiasis. IMPRESSION: No evidence of choledocholithiasis. Electronically Signed   By: Simonne ComeJohn  Watts M.D.   On: 08/17/2016 15:20    Anti-infectives: Anti-infectives    Start     Dose/Rate Route Frequency Ordered Stop   08/17/16 1147  ceFAZolin (ANCEF) IVPB 2g/100 mL premix     2 g 200 mL/hr over 30 Minutes Intravenous On call to O.R. 08/17/16 1147 08/17/16 1401      Assessment/Plan: s/p Procedure(s): LAPAROSCOPIC CHOLECYSTECTOMY WITH INTRAOPERATIVE CHOLANGIOGRAM (N/A) HERNIA REPAIR UMBILICAL ADULT (N/A) Add Toradol  Use PO pain meds primarily for pain control - Morphine only for back-up Encourage ambulation IS Possible discharge later today or tomorrow  LOS: 1 day    Nera Haworth K. 08/18/2016

## 2016-08-19 DIAGNOSIS — K8010 Calculus of gallbladder with chronic cholecystitis without obstruction: Secondary | ICD-10-CM | POA: Diagnosis not present

## 2016-08-19 MED ORDER — ONDANSETRON HCL 4 MG PO TABS
4.0000 mg | ORAL_TABLET | Freq: Once | ORAL | Status: AC
  Administered 2016-08-19: 4 mg via ORAL
  Filled 2016-08-19: qty 1

## 2016-08-19 NOTE — Discharge Summary (Signed)
Physician Discharge Summary  Patient ID: Jasmine ShowersLinda Ann Mckenzie MRN: [id number] DOB/AGE: 222/23/1982 35 y.o.  Admit date: 08/17/2016 Discharge date: 08/19/2016  Admission Diagnoses:  Chronic calculus cholecystitis  Discharge Diagnoses: same Active Problems:   Chronic cholecystitis with calculus   Discharged Condition: good  Hospital Course: Lap chole with IOC on 10/17. Kept overnight because of pain control and nausea.  Slowly improved - feeling better on POD #2.  Ready for discharge  Consults: None  Significant Diagnostic Studies: none  Treatments: surgery: lap chole with IOC  Discharge Exam: Blood pressure 114/76, pulse 71, temperature 98.3 F (36.8 C), temperature source Oral, resp. rate 16, height 5\' 2"  (1.575 m), weight 71.3 kg (157 lb 3.2 oz), last menstrual period 07/27/2016, SpO2 100 %. General appearance: alert, cooperative and no distress Resp: clear to auscultation bilaterally Cardio: regular rate and rhythm, S1, S2 normal, no murmur, click, rub or gallop GI: soft: + BS; incisional tenderness Incisions c/d/i  Disposition: 01-Home or Self Care  Discharge Instructions    Call MD for:  persistant nausea and vomiting    Complete by:  As directed    Call MD for:  persistant nausea and vomiting    Complete by:  As directed    Call MD for:  redness, tenderness, or signs of infection (pain, swelling, redness, odor or green/yellow discharge around incision site)    Complete by:  As directed    Call MD for:  redness, tenderness, or signs of infection (pain, swelling, redness, odor or green/yellow discharge around incision site)    Complete by:  As directed    Call MD for:  severe uncontrolled pain    Complete by:  As directed    Call MD for:  severe uncontrolled pain    Complete by:  As directed    Call MD for:  temperature >100.4    Complete by:  As directed    Call MD for:  temperature >100.4    Complete by:  As directed    Diet general    Complete by:  As directed    Diet general    Complete by:  As directed    Driving Restrictions    Complete by:  As directed    Do not drive while taking pain medications   Driving Restrictions    Complete by:  As directed    Do not drive while taking pain medications   Increase activity slowly    Complete by:  As directed    Increase activity slowly    Complete by:  As directed    May shower / Bathe    Complete by:  As directed    May shower / Bathe    Complete by:  As directed        Medication List    STOP taking these medications   metroNIDAZOLE 500 MG tablet Commonly known as:  FLAGYL   traMADol 50 MG tablet Commonly known as:  ULTRAM     TAKE these medications   acetaminophen 500 MG tablet Commonly known as:  TYLENOL Take 1,000 mg by mouth every 6 (six) hours as needed for moderate pain.   HAIR/SKIN/NAILS PO Take 1 tablet by mouth daily.   IMPLANON 68 MG Impl implant Generic drug:  etonogestrel Inject 1 each into the skin once.   ondansetron 4 MG tablet Commonly known as:  ZOFRAN Take 1 tablet (4 mg total) by mouth every 6 (six) hours. What changed:  when to take this  reasons  to take this   oxyCODONE-acetaminophen 5-325 MG tablet Commonly known as:  PERCOCET/ROXICET Take 1 tablet by mouth every 4 (four) hours as needed for severe pain.      Follow-up Information    Joanna Hall K., MD. Schedule an appointment as soon as possible for a visit in 3 week(s).   Specialty:  General Surgery Contact information: [address] [place] [address] [phone]           Signed: Wynona Luna. 08/19/2016, 8:14 AM

## 2016-08-19 NOTE — Care Management Note (Signed)
Case Management Note  Patient Details  Name: Jasmine ShowersLinda Ann Rush MRN: [id number] Date of Birth: Mar 17, 1981  Subjective/Objective:                 Independent patient form home, in obs, will DC to home self care today when ride arrives around 1:00pm.   Action/Plan:   Expected Discharge Date:                  Expected Discharge Plan:  Home/Self Care  In-House Referral:  NA  Discharge planning Services  CM Consult  Post Acute Care Choice:  NA Choice offered to:  NA  DME Arranged:  N/A DME Agency:  NA  HH Arranged:  NA HH Agency:  NA  Status of Service:  Completed, signed off  If discussed at Long Length of Stay Meetings, dates discussed:    Additional Comments:  Lawerance SabalDebbie Tanaja Ganger, RN 08/19/2016, 10:16 AM

## 2016-11-22 ENCOUNTER — Ambulatory Visit (INDEPENDENT_AMBULATORY_CARE_PROVIDER_SITE_OTHER): Payer: 59 | Admitting: Family Medicine

## 2016-11-22 VITALS — BP 110/66 | HR 77 | Temp 99.1°F | Resp 18 | Ht 62.0 in | Wt 134.0 lb

## 2016-11-22 DIAGNOSIS — N898 Other specified noninflammatory disorders of vagina: Secondary | ICD-10-CM

## 2016-11-22 LAB — POCT WET + KOH PREP
TRICH BY WET PREP: ABSENT
YEAST BY KOH: ABSENT
YEAST BY WET PREP: ABSENT

## 2016-11-22 NOTE — Patient Instructions (Signed)
     IF you received an x-ray today, you will receive an invoice from [facility]. Please contact [facility] at [phone] with questions or concerns regarding your invoice.   IF you received labwork today, you will receive an invoice from [facility]. Please contact [facility] at [phone] with questions or concerns regarding your invoice.   Our billing staff will not be able to assist you with questions regarding bills from these companies.  You will be contacted with the lab results as soon as they are available. The fastest way to get your results is to activate your My Chart account. Instructions are located on the last page of this paperwork. If you have not heard from us regarding the results in 2 weeks, please contact this office.     

## 2016-11-22 NOTE — Progress Notes (Signed)
Chief Complaint  Patient presents with  . Vaginal Discharge  . Abdominal Cramping    HPI   Pt reports that she was started on Depo Provera November 2017.  She reports that she was started on Depoprovera after having her Nexplanon removed by Gynecology.  She reports that on Depo she started to have continuous bleeding and was started on the pill.  The stopped the pill due to nausea symptoms. She was started on OCPs and had n/v so she stopped after 3 weeks She stopped the pills 2 weeks ago She reports that she has been having cramping since that time No LMP recorded (lmp unknown). Her vaginal discharge is currently yellow Her cramping is a lot like period cramps   Past Medical History:  Diagnosis Date  . Arthritis   . GERD (gastroesophageal reflux disease)   . Heart palpitations   . IBS (irritable bowel syndrome)     Current Outpatient Prescriptions  Medication Sig Dispense Refill  . ondansetron (ZOFRAN) 4 MG tablet Take 1 tablet (4 mg total) by mouth every 6 (six) hours. 12 tablet 0  . etonogestrel (IMPLANON) 68 MG IMPL implant Inject 1 each into the skin once.    Marland Kitchen. oxyCODONE-acetaminophen (PERCOCET/ROXICET) 5-325 MG tablet Take 1 tablet by mouth every 4 (four) hours as needed for severe pain. (Patient not taking: Reported on 11/22/2016) 40 tablet 0   No current facility-administered medications for this visit.     Allergies:  Allergies  Allergen Reactions  . No Known Allergies     Past Surgical History:  Procedure Laterality Date  . BREAST SURGERY     augmentation  . CHOLECYSTECTOMY N/A 08/17/2016   Procedure: LAPAROSCOPIC CHOLECYSTECTOMY WITH INTRAOPERATIVE CHOLANGIOGRAM;  Surgeon: Manus RuddMatthew Tsuei, MD;  Location: [facility] OR;  Service: General;  Laterality: N/A;  . UMBILICAL HERNIA REPAIR N/A 08/17/2016   Procedure: HERNIA REPAIR UMBILICAL ADULT;  Surgeon: Manus RuddMatthew Tsuei, MD;  Location: [facility] OR;  Service: General;  Laterality: N/A;    Social History   Social History  .  Marital status: Single    Spouse name: N/A  . Number of children: N/A  . Years of education: N/A   Social History Main Topics  . Smoking status: Never Smoker  . Smokeless tobacco: Never Used  . Alcohol use Yes     Comment: occasional wine  . Drug use: No  . Sexual activity: Yes   Other Topics Concern  . None   Social History Narrative  . None    ROS  Objective: Vitals:   11/22/16 1506  BP: 110/66  Pulse: 77  Resp: 18  Temp: 99.1 F (37.3 C)  TempSrc: Oral  SpO2: 100%  Weight: 134 lb (60.8 kg)  Height: 5\' 2"  (1.575 m)    Physical Exam  Constitutional: She is oriented to person, place, and time. She appears well-developed and well-nourished.  Eyes: Conjunctivae and EOM are normal.  Pulmonary/Chest: Effort normal.  Neurological: She is alert and oriented to person, place, and time.   Vaginal exam Labia normal bilaterally without skin lesions Urethral meatus normal appearing without erythema Vagina with white, watery discharge No CMT, ovaries small and not palpable Uterus midline, non-tender   Assessment and Plan Bonita QuinLinda was seen today for vaginal discharge and abdominal cramping.  Diagnoses and all orders for this visit:  Vaginal discharge -     POCT Wet + KOH Prep  Vaginal leukorrhea No need to treat with antibiotic or antifungul No trich, clue cells or yeast  Rashon Westrup A  Nolon Rod

## 2016-11-28 ENCOUNTER — Ambulatory Visit (HOSPITAL_COMMUNITY)
Admission: EM | Admit: 2016-11-28 | Discharge: 2016-11-28 | Disposition: A | Discharge status: Home / Self Care | Payer: 59 | Attending: Family Medicine | Admitting: Family Medicine

## 2016-11-28 ENCOUNTER — Encounter (HOSPITAL_COMMUNITY): Payer: Self-pay | Admitting: Emergency Medicine

## 2016-11-28 DIAGNOSIS — Z8249 Family history of ischemic heart disease and other diseases of the circulatory system: Secondary | ICD-10-CM | POA: Insufficient documentation

## 2016-11-28 DIAGNOSIS — K219 Gastro-esophageal reflux disease without esophagitis: Secondary | ICD-10-CM | POA: Insufficient documentation

## 2016-11-28 DIAGNOSIS — N898 Other specified noninflammatory disorders of vagina: Secondary | ICD-10-CM | POA: Diagnosis not present

## 2016-11-28 DIAGNOSIS — R1030 Lower abdominal pain, unspecified: Secondary | ICD-10-CM

## 2016-11-28 DIAGNOSIS — N3001 Acute cystitis with hematuria: Secondary | ICD-10-CM | POA: Insufficient documentation

## 2016-11-28 DIAGNOSIS — Z833 Family history of diabetes mellitus: Secondary | ICD-10-CM | POA: Diagnosis not present

## 2016-11-28 DIAGNOSIS — K589 Irritable bowel syndrome without diarrhea: Secondary | ICD-10-CM | POA: Diagnosis not present

## 2016-11-28 DIAGNOSIS — R109 Unspecified abdominal pain: Secondary | ICD-10-CM | POA: Diagnosis present

## 2016-11-28 LAB — POCT PREGNANCY, URINE: Preg Test, Ur: NEGATIVE

## 2016-11-28 LAB — POCT URINALYSIS DIP (DEVICE)
Bilirubin Urine: NEGATIVE
Glucose, UA: NEGATIVE mg/dL
Ketones, ur: NEGATIVE mg/dL
NITRITE: NEGATIVE
PH: 6 (ref 5.0–8.0)
PROTEIN: NEGATIVE mg/dL
SPECIFIC GRAVITY, URINE: 1.01 (ref 1.005–1.030)
UROBILINOGEN UA: 0.2 mg/dL (ref 0.0–1.0)

## 2016-11-28 MED ORDER — NAPROXEN 500 MG PO TABS: 500.0000 mg | ORAL_TABLET | Freq: Two times a day (BID) | ORAL | 0 refills | Status: DC | PRN

## 2016-11-28 MED ORDER — FLUCONAZOLE 150 MG PO TABS: 150.0000 mg | ORAL_TABLET | Freq: Once | ORAL | 0 refills | Status: AC

## 2016-11-28 MED ORDER — CEPHALEXIN 500 MG PO CAPS: 500.0000 mg | ORAL_CAPSULE | Freq: Two times a day (BID) | ORAL | 0 refills | Status: AC

## 2016-11-28 NOTE — Discharge Instructions (Signed)
Start Keflex twice a day as directed. Take Diflucan 150mg  one time to help prevent yeast infection from antibiotic use. May take Naproxen 500mg  every 12 hours as needed for pain. Follow-up pending lab results.

## 2016-11-29 NOTE — ED Provider Notes (Signed)
CSN: [id number]     Arrival date & time 11/28/16  1858 History   First MD Initiated Contact with Patient 11/28/16 2030     Chief Complaint  Patient presents with  . Abdominal Pain   (Consider location/radiation/quality/duration/timing/severity/associated sxs/prior Treatment) 36 year old female presents with lower abdominal pain, dysuria, and vaginal discharge for about 2 weeks. Started with unusual vaginal discharge in which she went to Urgent Care at [facility]. They performed a vaginal exam and wet prep that was normal. Did not perform other STD testing. Told her she was probably ovulating and was not given any medication for her symptoms. Vaginal discharge has continued but now experiencing more pain with urination and flank pain. She is sexually active and has been on DepoProvera and oral contraceptives recently but no protection in the past month. No daily medication.    The history is provided by the patient.    Past Medical History:  Diagnosis Date  . Arthritis   . GERD (gastroesophageal reflux disease)   . Heart palpitations   . IBS (irritable bowel syndrome)    Past Surgical History:  Procedure Laterality Date  . BREAST SURGERY     augmentation  . CHOLECYSTECTOMY N/A 08/17/2016   Procedure: LAPAROSCOPIC CHOLECYSTECTOMY WITH INTRAOPERATIVE CHOLANGIOGRAM;  Surgeon: Manus Rudd, MD;  Location: [facility] OR;  Service: General;  Laterality: N/A;  . UMBILICAL HERNIA REPAIR N/A 08/17/2016   Procedure: HERNIA REPAIR UMBILICAL ADULT;  Surgeon: Manus Rudd, MD;  Location: [facility] OR;  Service: General;  Laterality: N/A;   Family History  Problem Relation Age of Onset  . Hypertension Mother   . Diabetes Father   . Hypertension Father    Social History  Substance Use Topics  . Smoking status: Never Smoker  . Smokeless tobacco: Never Used  . Alcohol use Yes     Comment: occasional wine   OB History    No data available     Review of Systems  Constitutional: Negative for chills,  fatigue and fever.  Gastrointestinal: Positive for abdominal pain. Negative for blood in stool, constipation, diarrhea, nausea and vomiting.  Genitourinary: Positive for dysuria, flank pain, frequency and vaginal discharge. Negative for hematuria, pelvic pain and vaginal bleeding.  Musculoskeletal: Positive for back pain. Negative for neck pain and neck stiffness.  Skin: Negative for rash and wound.  Neurological: Negative for dizziness, weakness, numbness and headaches.  Hematological: Negative for adenopathy.    Allergies  No known allergies  Home Medications   Prior to Admission medications   Medication Sig Start Date End Date Taking? Authorizing Provider  cephALEXin (KEFLEX) 500 MG capsule Take 1 capsule (500 mg total) by mouth 2 (two) times daily. 11/28/16 12/05/16  Sudie Grumbling, NP  naproxen (NAPROSYN) 500 MG tablet Take 1 tablet (500 mg total) by mouth 2 (two) times daily as needed. 11/28/16   Sudie Grumbling, NP  ondansetron (ZOFRAN) 4 MG tablet Take 1 tablet (4 mg total) by mouth every 6 (six) hours. 08/07/16   Fayrene Helper, PA-C   Meds Ordered and Administered this Visit  Medications - No data to display  LMP  (LMP Unknown)  No data found.   Physical Exam  Constitutional: She is oriented to person, place, and time. She appears well-developed and well-nourished. No distress.  Neck: Normal range of motion. Neck supple.  Cardiovascular: Normal rate, regular rhythm and normal heart sounds.   Pulmonary/Chest: Effort normal and breath sounds normal. No respiratory distress. She has no wheezes.  Abdominal: Soft. Normal  appearance and bowel sounds are normal. There is no hepatosplenomegaly. There is tenderness in the right lower quadrant and suprapubic area. There is CVA tenderness (mild right). There is no rigidity, no rebound and no guarding.  Genitourinary: Pelvic exam was performed with patient in the knee-chest position. There is no rash, tenderness, lesion or injury on the right  labia. There is no rash, tenderness, lesion or injury on the left labia. Cervix exhibits no motion tenderness and no friability. Right adnexum displays no mass and no tenderness. Left adnexum displays no mass and no tenderness. No erythema, tenderness or bleeding in the vagina. Vaginal discharge (white thin) found.  Lymphadenopathy:    She has no cervical adenopathy.       Right: No inguinal adenopathy present.       Left: No inguinal adenopathy present.  Neurological: She is alert and oriented to person, place, and time.  Skin: Skin is warm and dry.  Psychiatric: She has a normal mood and affect. Her behavior is normal. Judgment and thought content normal.    Urgent Care Course     Procedures (including critical care time)  Labs Review Labs Reviewed  POCT URINALYSIS DIP (DEVICE) - Abnormal; Notable for the following:       Result Value   Hgb urine dipstick TRACE (*)    Leukocytes, UA MODERATE (*)    All other components within normal limits  URINE CULTURE  POCT PREGNANCY, URINE  CERVICOVAGINAL ANCILLARY ONLY    Imaging Review No results found.   Visual Acuity Review  Right Eye Distance:   Left Eye Distance:   Bilateral Distance:    Right Eye Near:   Left Eye Near:    Bilateral Near:         MDM   1. Vaginal discharge   2. Lower abdominal pain   3. Acute cystitis with hematuria    Discussed that her symptoms could be due to multiple causes. Urinalysis results reviewed with patient. Since she is having some CVA tenderness and suprapubic pain, will treat for UTI but will adjust medication based upon other lab results. Sent urine for culture and cervical swab for STD and BV/Trich/yeast testing. Recommend start Keflex 500mg  twice a day as directed. Take Diflucan 150mg  one time. May use Naproxen 500mg  every 12 hours as needed for pain. No sexual intercourse for at least 2 weeks. Follow-up pending lab results.     Sudie GrumblingAnn Berry Natassia Guthridge, NP 11/29/16 1000

## 2016-11-30 ENCOUNTER — Telehealth (HOSPITAL_COMMUNITY): Payer: Self-pay | Admitting: Internal Medicine

## 2016-11-30 LAB — URINE CULTURE

## 2016-11-30 LAB — CERVICOVAGINAL ANCILLARY ONLY
Chlamydia: POSITIVE — AB
NEISSERIA GONORRHEA: NEGATIVE
WET PREP (BD AFFIRM): POSITIVE — AB

## 2016-11-30 MED ORDER — METRONIDAZOLE 500 MG PO TABS: 500.0000 mg | ORAL_TABLET | Freq: Two times a day (BID) | ORAL | 0 refills | Status: DC

## 2016-11-30 MED ORDER — AZITHROMYCIN 500 MG PO TABS: 1000.0000 mg | ORAL_TABLET | Freq: Once | ORAL | 0 refills | Status: AC

## 2016-11-30 NOTE — Telephone Encounter (Signed)
Clinical staff, please let patient and health department know that test for chlamydia was positive.  Rx zithromax was sent to the pharmacy of record, [facility] on [address].   Test for gardnerella (bacterial vaginosis) was also positive; this only needs to be treated if there are persistent symptoms, such as vaginal irritation/discharge, after treatment for chlamydia.  If these symptoms are persistent, take rx for metronidazole 500mg  bid x 7d #14 no refills.   Urine culture does not clearly demonstrate a UTI.  Recheck for further evaluation if not improving.  Ria ClockLaura Tenzin Pavon MD

## 2016-12-16 ENCOUNTER — Telehealth: Payer: Self-pay

## 2016-12-16 NOTE — Telephone Encounter (Signed)
Patient called and stated that she was seen over a week ago and was given some medicine that when she took it, it made her sick on her stomach and she was calling to see if she needs to take it again or if she would be fine not taking it. She stated that it started with the Letter "A" but I do not see anything like that on her med list.   Her call back number is [phone]

## 2016-12-17 NOTE — Telephone Encounter (Signed)
I see she was seen in ed and given azithromycin for chlamydia (see ed note and results)

## 2016-12-18 ENCOUNTER — Ambulatory Visit (INDEPENDENT_AMBULATORY_CARE_PROVIDER_SITE_OTHER): Payer: 59 | Admitting: Family Medicine

## 2016-12-18 VITALS — BP 128/90 | HR 73 | Temp 98.3°F | Resp 18 | Ht 62.0 in | Wt 129.1 lb

## 2016-12-18 DIAGNOSIS — R109 Unspecified abdominal pain: Secondary | ICD-10-CM | POA: Diagnosis not present

## 2016-12-18 DIAGNOSIS — Z113 Encounter for screening for infections with a predominantly sexual mode of transmission: Secondary | ICD-10-CM | POA: Diagnosis not present

## 2016-12-18 LAB — POCT URINE PREGNANCY: Preg Test, Ur: NEGATIVE

## 2016-12-18 LAB — POC MICROSCOPIC URINALYSIS (UMFC)

## 2016-12-18 LAB — POCT URINALYSIS DIP (MANUAL ENTRY)
BILIRUBIN UA: NEGATIVE
Blood, UA: NEGATIVE
Glucose, UA: NEGATIVE
LEUKOCYTES UA: NEGATIVE
Nitrite, UA: NEGATIVE
PH UA: 6
Protein Ur, POC: NEGATIVE
Spec Grav, UA: 1.02
Urobilinogen, UA: 0.2

## 2016-12-18 MED ORDER — DOXYCYCLINE HYCLATE 100 MG PO CAPS: 100.0000 mg | ORAL_CAPSULE | Freq: Two times a day (BID) | ORAL | 0 refills | Status: DC

## 2016-12-18 NOTE — Patient Instructions (Addendum)
Doxycyline 100 mg twice x 7 days take with food.   You will be notified of your lab results. Your partner needs to come in immediately to be treated.  IF you received an x-ray today, you will receive an invoice from [facility]. Please contact [facility] at [phone] with questions or concerns regarding your invoice.   IF you received labwork today, you will receive an invoice from [facility]. Please contact [facility] at [phone] with questions or concerns regarding your invoice.   Our billing staff will not be able to assist you with questions regarding bills from these companies.  You will be contacted with the lab results as soon as they are available. The fastest way to get your results is to activate your My Chart account. Instructions are located on the last page of this paperwork. If you have not heard from us regarding the results in 2 weeks, please contact this office.       Chlamydia, Female Chlamydia is an infection. It is spread from one person to another person during sexual contact. This infection can be in the cervix, urine tube (urethra), throat, or bottom (rectum). This infection needs treatment. HOME CARE   Take your medicines (antibiotics) as told. Finish them even if you start to feel better.  Only take medicine as told by your doctor.  Tell your sex partner(s) that you have chlamydia. They must also be treated.  Do not have sex until your doctor says it is okay.  Rest.  Eat healthy. Drink enough fluids to keep your pee (urine) clear or pale yellow.  Keep all doctor visits as told. GET HELP IF:  You have pain when you pee.  You have belly pain.  You have vaginal discharge.  You have pain during sex.  You have bleeding between periods and after sex.  You have a fever. GET HELP RIGHT AWAY IF:   You feel sick to your stomach (nauseous) or you throw up (vomit).  You sweat much more than normal (diaphoresis).  You have trouble  swallowing. This information is not intended to replace advice given to you by your health care provider. Make sure you discuss any questions you have with your health care provider. Document Released: 07/27/2008 Document Revised: 02/09/2016 Document Reviewed: 06/25/2013 Elsevier Interactive Patient Education  2017 [employer]

## 2016-12-18 NOTE — Progress Notes (Signed)
Patient ID: Jasmine Mckenzie, female    DOB: July 03, 1981, 36 y.o.   MRN: [id number]  PCP: No PCP Per Patient  Chief Complaint  Patient presents with  . Abdominal Cramping    Was treated last visit for Clamydia & threw up the medication    Subjective:  HPI 36 year old female presents for evaluation of abdominal cramping x 3-4 weeks. Reports cramps feels like "menstraul like" cramping.  She was seen 11/28/2016 [facility] with a complaint of vaginal discharge and was later dx with Chlamydia and UTI. Reports being prescribed Azithromycin for Chlamydia and vomited as soon as she took the medication. LMPD -cycle ended February 3rd. Reports resolution of vaginal discharge and UTI symptoms. Reports sexual partner was never tx although she reports they haven't engaged in sexual activity since her dx.  Social History   Social History  . Marital status: Single    Spouse name: N/A  . Number of children: N/A  . Years of education: N/A   Occupational History  . Not on file.   Social History Main Topics  . Smoking status: Never Smoker  . Smokeless tobacco: Never Used  . Alcohol use Yes     Comment: occasional wine  . Drug use: No  . Sexual activity: Yes   Other Topics Concern  . Not on file   Social History Narrative  . No narrative on file    Family History  Problem Relation Age of Onset  . Hypertension Mother   . Diabetes Father   . Hypertension Father    Review of Systems  See HPI  Patient Active Problem List   Diagnosis Date Noted  . Chronic cholecystitis with calculus 08/17/2016    Allergies  Allergen Reactions  . No Known Allergies     Prior to Admission medications   Medication Sig Start Date End Date Taking? Authorizing Provider  escitalopram (LEXAPRO) 10 MG tablet Take 10 mg by mouth daily.   Yes Historical Provider, MD  naproxen (NAPROSYN) 500 MG tablet Take 1 tablet (500 mg total) by mouth 2 (two) times daily as needed. 11/28/16  Yes Sudie Grumbling, NP  ondansetron (ZOFRAN) 4 MG tablet Take 1 tablet (4 mg total) by mouth every 6 (six) hours. 08/07/16  Yes Fayrene Helper, PA-C  metroNIDAZOLE (FLAGYL) 500 MG tablet Take 1 tablet (500 mg total) by mouth 2 (two) times daily. Patient not taking: Reported on 12/18/2016 11/30/16   Eustace Moore, MD    Past Medical, Surgical Family and Social History reviewed and updated.   Objective:   Today's Vitals   12/18/16 1032  BP: 128/90  Pulse: 73  Resp: 18  Temp: 98.3 F (36.8 C)  TempSrc: Oral  SpO2: 100%  Weight: 129 lb 2 oz (58.6 kg)  Height: 5\' 2"  (1.575 m)    Wt Readings from Last 3 Encounters:  12/18/16 129 lb 2 oz (58.6 kg)  11/22/16 134 lb (60.8 kg)  08/17/16 157 lb 3.2 oz (71.3 kg)   Physical Exam  Constitutional: She is oriented to person, place, and time. She appears well-developed and well-nourished.  HENT:  Head: Normocephalic and atraumatic.  Eyes: Pupils are equal, round, and reactive to light.  Neck: Normal range of motion.  Cardiovascular: Normal rate, regular rhythm, normal heart sounds and intact distal pulses.   Pulmonary/Chest: Effort normal and breath sounds normal.  Abdominal: Soft. Bowel sounds are normal. She exhibits no distension and no mass. There is no  tenderness. There is no rebound and no guarding.  Unable to reproduce lower abdominal cramping or tenderness  Genitourinary:  Genitourinary Comments: Deferred based on HPI  Lymphadenopathy:    She has no cervical adenopathy.  Neurological: She is alert and oriented to person, place, and time.  Skin: Skin is warm and dry.  Psychiatric: She has a normal mood and affect. Her behavior is normal. Judgment and thought content normal.      Assessment & Plan:  1. Screen for STD (sexually transmitted disease) 2. Abdominal cramping  Re screening for GC/Chlaymdia. Treating empirically for Chlamydia with Doxycycline 100 mg twice daily x 10 days as patient did not tolerate Azithromycin. Refrain from any  sexual intercourse until treatment has completed.  Use condoms with all sexual encounter. Encourage partner to come into office for treatment or go to the health department.  Return in 3 months for retesting.  Follow-up sooner if needed.   Godfrey PickKimberly S. Tiburcio PeaHarris, MSN, FNP-C [facility] at [facility] [phone]

## 2016-12-18 NOTE — Telephone Encounter (Signed)
She was treated by the Ed.

## 2016-12-21 LAB — GC/CHLAMYDIA PROBE AMP
CHLAMYDIA, DNA PROBE: POSITIVE — AB
NEISSERIA GONORRHOEAE BY PCR: NEGATIVE

## 2017-02-14 ENCOUNTER — Ambulatory Visit (INDEPENDENT_AMBULATORY_CARE_PROVIDER_SITE_OTHER): Payer: 59 | Admitting: Physician Assistant

## 2017-02-14 VITALS — BP 136/92 | HR 65 | Temp 99.2°F | Resp 16 | Ht 63.5 in | Wt 123.4 lb

## 2017-02-14 DIAGNOSIS — N898 Other specified noninflammatory disorders of vagina: Secondary | ICD-10-CM | POA: Diagnosis not present

## 2017-02-14 DIAGNOSIS — R82998 Other abnormal findings in urine: Secondary | ICD-10-CM

## 2017-02-14 DIAGNOSIS — R8299 Other abnormal findings in urine: Secondary | ICD-10-CM

## 2017-02-14 DIAGNOSIS — R1013 Epigastric pain: Secondary | ICD-10-CM | POA: Diagnosis not present

## 2017-02-14 LAB — POCT CBC
Granulocyte percent: 51.8 %G (ref 37–80)
HEMATOCRIT: 37.8 % (ref 37.7–47.9)
HEMOGLOBIN: 13 g/dL (ref 12.2–16.2)
LYMPH, POC: 2.7 (ref 0.6–3.4)
MCH, POC: 31.2 pg (ref 27–31.2)
MCHC: 34.3 g/dL (ref 31.8–35.4)
MCV: 90.8 fL (ref 80–97)
MID (cbc): 0.1 (ref 0–0.9)
MPV: 7.3 fL (ref 0–99.8)
PLATELET COUNT, POC: 312 10*3/uL (ref 142–424)
POC Granulocyte: 3 (ref 2–6.9)
POC LYMPH %: 45.9 % (ref 10–50)
POC MID %: 2.3 %M (ref 0–12)
RBC: 4.16 M/uL (ref 4.04–5.48)
RDW, POC: 13.8 %
WBC: 5.8 10*3/uL (ref 4.6–10.2)

## 2017-02-14 LAB — POCT WET + KOH PREP
TRICH BY WET PREP: ABSENT
TRICH BY WET PREP: ABSENT
YEAST BY KOH: ABSENT
YEAST BY KOH: ABSENT
YEAST BY WET PREP: ABSENT
YEAST BY WET PREP: ABSENT

## 2017-02-14 LAB — POC MICROSCOPIC URINALYSIS (UMFC): MUCUS RE: ABSENT

## 2017-02-14 LAB — POCT URINALYSIS DIP (MANUAL ENTRY)
BILIRUBIN UA: NEGATIVE mg/dL
Bilirubin, UA: NEGATIVE
Blood, UA: NEGATIVE
Glucose, UA: NEGATIVE mg/dL
NITRITE UA: NEGATIVE
PH UA: 5.5 (ref 5.0–8.0)
PROTEIN UA: NEGATIVE mg/dL
Spec Grav, UA: 1.015 (ref 1.010–1.025)
Urobilinogen, UA: 0.2 E.U./dL

## 2017-02-14 LAB — POCT URINE PREGNANCY: PREG TEST UR: NEGATIVE

## 2017-02-14 MED ORDER — METRONIDAZOLE 0.75 % VA GEL: 1.0000 | Freq: Two times a day (BID) | VAGINAL | 0 refills | Status: AC

## 2017-02-14 NOTE — Patient Instructions (Addendum)
For vaginal odor, we are going to treat with flagyl inserts as this should help with any odor. In terms or oral antibiotics, we are going to hold off at this moment while your results are pending. I recommend starting an oral probiotic as well.  Please eat a bland diet over the next few days. Avoid sexually activity until you hear from our office with your results. If you develop any fever, vomiting, or worsening pain, please seek care sooner. Thank you for letting me participate in your health and well being.    IF you received an x-ray today, you will receive an invoice from [facility]. Please contact [facility] at [phone] with questions or concerns regarding your invoice.   IF you received labwork today, you will receive an invoice from [facility]. Please contact [facility] at [phone] with questions or concerns regarding your invoice.   Our billing staff will not be able to assist you with questions regarding bills from these companies.  You will be contacted with the lab results as soon as they are available. The fastest way to get your results is to activate your My Chart account. Instructions are located on the last page of this paperwork. If you have not heard from [place] regarding the results in 2 weeks, please contact this office.

## 2017-02-14 NOTE — Progress Notes (Deleted)
  MRN: [id number] DOB: 07-15-1981  Subjective:   Jasmine Mckenzie is a 36 y.o. female presenting for chief complaint of Vaginal Discharge    [name] has a current medication list which includes the following prescription(s): fluoxetine, naproxen, and ondansetron. Also is allergic to no known allergies.  [name]  has a past medical history of Arthritis; GERD (gastroesophageal reflux disease); Heart palpitations; and IBS (irritable bowel syndrome). Also  has a past surgical history that includes Breast surgery; Cholecystectomy (N/A, 08/17/2016); and Umbilical hernia repair (N/A, 08/17/2016).  Objective:   Vitals: BP (!) 136/92 (BP Location: Right Arm, Patient Position: Sitting, Cuff Size: Normal)   Pulse 65   Temp 99.2 F (37.3 C) (Oral)   Resp 16   Ht 5' 3.5" (1.613 m)   Wt 123 lb 6.4 oz (56 kg)   LMP 02/10/2017   SpO2 99%   BMI 21.52 kg/m   Physical Exam  Results for orders placed or performed in visit on 02/14/17 (from the past 24 hour(s))  POCT urine pregnancy     Status: None   Collection Time: 02/14/17 12:39 PM  Result Value Ref Range   Preg Test, Ur Negative Negative  POCT urinalysis dipstick     Status: Abnormal   Collection Time: 02/14/17 12:39 PM  Result Value Ref Range   Color, UA yellow yellow   Clarity, UA clear clear   Glucose, UA negative negative mg/dL   Bilirubin, UA negative negative   Ketones, POC UA negative negative mg/dL   Spec Grav, UA 4.098 1.191 - 1.025   Blood, UA negative negative   pH, UA 5.5 5.0 - 8.0   Protein Ur, POC negative negative mg/dL   Urobilinogen, UA 0.2 0.2 or 1.0 E.U./dL   Nitrite, UA Negative Negative   Leukocytes, UA Trace (A) Negative  POCT Wet + KOH Prep     Status: Abnormal   Collection Time: 02/14/17 12:40 PM  Result Value Ref Range   Yeast by KOH Absent Absent   Yeast by wet prep Absent Absent   WBC by wet prep Few Few   Clue Cells Wet Prep HPF POC None None   Trich by wet prep Absent Absent   Bacteria Wet Prep HPF POC  Many (A) Few   Epithelial Cells By [employer] ([facility]) Moderate (A) None, Few, Too numerous to count   RBC,UR,HPF,POC None None RBC/hpf  POCT Microscopic Urinalysis ([facility])     Status: None   Collection Time: 02/14/17 12:40 PM  Result Value Ref Range   WBC,UR,HPF,POC None None WBC/hpf   RBC,UR,HPF,POC None None RBC/hpf   Bacteria None None, Too numerous to count   Mucus Absent Absent   Epithelial Cells, UR Per Microscopy None None, Too numerous to count cells/hpf    Assessment and Plan :     Wallis Bamberg, PA-C [facility] [phone] 02/14/2017  12:28 PM

## 2017-02-14 NOTE — Progress Notes (Signed)
02/16/2017 at 11:46 AM  Jasmine Mckenzie / DOB: Mar 15, 1981 / MRN: [id number]  The patient has Chronic cholecystitis with calculus on her problem list.  SUBJECTIVE  Jasmine Mckenzie is a 36 y.o. female who complains of vaginal odor, discharge, right lower pelvic pain, and nausea x 6 days. Noes it all started after her menstrual cycle. LMP 02/08/17. She notes this has been an ongoing issue after she finishes her cycle for the past few years.  She denies vomiting, dysuria, urinary frequency, urinary urgency, flank pain, genital rash and genital irritation. Has not tried anything for relief. Pt is sexually active. Has one sexual partner. Has hx of BV, yeast, UTI, and chlamydia. Was treated for chlamydia in 12/2016. Notes her partner said he was also treated before they slept together again. She denies alcohol use.   She  has a past medical history of Arthritis; GERD (gastroesophageal reflux disease); Heart palpitations; and IBS (irritable bowel syndrome).    Medications reviewed and updated by myself where necessary, and exist elsewhere in the encounter.   [name] is allergic to no known allergies. She  reports that she has never smoked. She has never used smokeless tobacco. She reports that she drinks alcohol. She reports that she does not use drugs. She  reports that she currently engages in sexual activity. The patient  has a past surgical history that includes Breast surgery; Cholecystectomy (N/A, 08/17/2016); and Umbilical hernia repair (N/A, 08/17/2016).  Her family history includes Diabetes in her father; Hypertension in her father and mother.  Review of Systems  Constitutional: Negative for chills, diaphoresis and fever.    OBJECTIVE  Her  height is 5' 3.5" (1.613 m) and weight is 123 lb 6.4 oz (56 kg). Her oral temperature is 99.2 F (37.3 C). Her blood pressure is 136/92 (abnormal) and her pulse is 65. Her respiration is 16 and oxygen saturation is 99%.  The patient's body mass index is  21.52 kg/m.  Physical Exam  Constitutional: She is oriented to person, place, and time. She appears well-developed and well-nourished. No distress.  HENT:  Head: Normocephalic and atraumatic.  Eyes: Conjunctivae are normal.  Neck: Normal range of motion.  GI: Soft. Normal appearance and bowel sounds are normal. There is tenderness in the epigastric area. There is no rigidity, no guarding, no CVA tenderness, no tenderness at McBurney's point and negative Murphy's sign.  Genitourinary: Uterus normal. Cervix exhibits discharge (bloody discharge noted from cervical os) and friability. Right adnexum displays no tenderness and no fullness. Left adnexum displays no tenderness and no fullness. Vaginal discharge (white creamy with fishy odor ) found.  Neurological: She is alert and oriented to person, place, and time.  Skin: Skin is warm and dry.  Psychiatric: She has a normal mood and affect.    No results found for this or any previous visit (from the past 24 hour(s)).  ASSESSMENT & PLAN  Charmine was seen today for vaginal discharge.  Diagnoses and all orders for this visit:  Vaginal discharge -     POCT Wet + KOH Prep -     POCT urine pregnancy -     POCT urinalysis dipstick -     POCT Microscopic Urinalysis ([facility]) -     POCT CBC -     GC/Chlamydia Probe Amp -     HIV antibody -     RPR -     Hepatitis panel, acute -     POCT Wet + KOH Prep  Vaginal odor -     metroNIDAZOLE (METROGEL) 0.75 % vaginal gel; Place 1 Applicatorful vaginally 2 (two) times daily.  Abdominal pain, epigastric -     Lipase  Leukocytes in urine -     Urine culture   Labs pending, will treat empirically for BV due to PE findings with vaginal metrogel. Instructed to avoid all sexual activity until labs return. Given strict ED precautions.   The patient was advised to call or come back to clinic if she does not see an improvement in symptoms, or worsens with the above plan.   Benjiman Core,  PA-C [facility] 02/16/2017 11:46 AM

## 2017-02-15 LAB — HEPATITIS PANEL, ACUTE
HEP B C IGM: NEGATIVE
HEP B S AG: NEGATIVE
Hep A IgM: NEGATIVE
Hep C Virus Ab: <0.1 s/co ratio (ref 0.0–0.9)

## 2017-02-15 LAB — GC/CHLAMYDIA PROBE AMP
Chlamydia trachomatis, NAA: NEGATIVE
Neisseria gonorrhoeae by PCR: NEGATIVE

## 2017-02-15 LAB — RPR: RPR Ser Ql: NONREACTIVE

## 2017-02-15 LAB — LIPASE: LIPASE: 22 U/L (ref 14–72)

## 2017-02-15 LAB — URINE CULTURE

## 2017-02-15 LAB — HIV ANTIBODY (ROUTINE TESTING W REFLEX): HIV Screen 4th Generation wRfx: NONREACTIVE

## 2017-07-05 ENCOUNTER — Encounter (HOSPITAL_COMMUNITY): Payer: Self-pay | Admitting: Emergency Medicine

## 2017-07-05 DIAGNOSIS — R102 Pelvic and perineal pain: Secondary | ICD-10-CM | POA: Diagnosis not present

## 2017-07-05 DIAGNOSIS — N83292 Other ovarian cyst, left side: Secondary | ICD-10-CM | POA: Insufficient documentation

## 2017-07-05 DIAGNOSIS — R1030 Lower abdominal pain, unspecified: Secondary | ICD-10-CM | POA: Diagnosis present

## 2017-07-05 NOTE — ED Triage Notes (Signed)
Pt to ED c/o lower abd cramping x 2 hours accompanied by bloated feeling. Pt states she's had her gallbladder out and she feels like this could be gas, but it's too painful to stay at home. LMP 06/22/17, pt states the pain feels like she is in labor. Denies N/V/D, no fevers. Pt denies urinary symptoms.

## 2017-07-06 ENCOUNTER — Emergency Department (HOSPITAL_COMMUNITY): Payer: 59

## 2017-07-06 ENCOUNTER — Emergency Department (HOSPITAL_COMMUNITY)
Admission: EM | Admit: 2017-07-06 | Discharge: 2017-07-06 | Disposition: A | Discharge status: Home / Self Care | Payer: 59 | Attending: Emergency Medicine | Admitting: Emergency Medicine

## 2017-07-06 DIAGNOSIS — N83202 Unspecified ovarian cyst, left side: Secondary | ICD-10-CM

## 2017-07-06 DIAGNOSIS — R102 Pelvic and perineal pain: Secondary | ICD-10-CM

## 2017-07-06 LAB — CBC
HEMATOCRIT: 35.3 % — AB (ref 36.0–46.0)
HEMOGLOBIN: 11.8 g/dL — AB (ref 12.0–15.0)
MCH: 30.3 pg (ref 26.0–34.0)
MCHC: 33.4 g/dL (ref 30.0–36.0)
MCV: 90.5 fL (ref 78.0–100.0)
Platelets: 283 10*3/uL (ref 150–400)
RBC: 3.9 MIL/uL (ref 3.87–5.11)
RDW: 13.2 % (ref 11.5–15.5)
WBC: 8.6 10*3/uL (ref 4.0–10.5)

## 2017-07-06 LAB — COMPREHENSIVE METABOLIC PANEL
ALK PHOS: 40 U/L (ref 38–126)
ALT: 22 U/L (ref 14–54)
ANION GAP: 6 (ref 5–15)
AST: 22 U/L (ref 15–41)
Albumin: 4.1 g/dL (ref 3.5–5.0)
BILIRUBIN TOTAL: 0.4 mg/dL (ref 0.3–1.2)
BUN: 17 mg/dL (ref 6–20)
CALCIUM: 8.7 mg/dL — AB (ref 8.9–10.3)
CO2: 24 mmol/L (ref 22–32)
Chloride: 107 mmol/L (ref 101–111)
Creatinine, Ser: 0.83 mg/dL (ref 0.44–1.00)
Glucose, Bld: 89 mg/dL (ref 65–99)
POTASSIUM: 4.2 mmol/L (ref 3.5–5.1)
Sodium: 137 mmol/L (ref 135–145)
TOTAL PROTEIN: 7.2 g/dL (ref 6.5–8.1)

## 2017-07-06 LAB — URINALYSIS, ROUTINE W REFLEX MICROSCOPIC
BILIRUBIN URINE: NEGATIVE
Glucose, UA: NEGATIVE mg/dL
Hgb urine dipstick: NEGATIVE
KETONES UR: NEGATIVE mg/dL
LEUKOCYTES UA: NEGATIVE
NITRITE: NEGATIVE
PH: 6 (ref 5.0–8.0)
Protein, ur: NEGATIVE mg/dL
SPECIFIC GRAVITY, URINE: 1.029 (ref 1.005–1.030)

## 2017-07-06 LAB — LIPASE, BLOOD: Lipase: 31 U/L (ref 11–51)

## 2017-07-06 LAB — POC URINE PREG, ED: PREG TEST UR: NEGATIVE

## 2017-07-06 MED ORDER — IBUPROFEN 800 MG PO TABS: 800.0000 mg | ORAL_TABLET | Freq: Three times a day (TID) | ORAL | 0 refills | Status: DC | PRN

## 2017-07-06 MED ORDER — IBUPROFEN 400 MG PO TABS
400.0000 mg | ORAL_TABLET | Freq: Once | ORAL | Status: AC | PRN
  Administered 2017-07-06: 400 mg via ORAL

## 2017-07-06 MED ORDER — TRAMADOL HCL 50 MG PO TABS: 50.0000 mg | ORAL_TABLET | Freq: Four times a day (QID) | ORAL | 0 refills | Status: DC | PRN

## 2017-07-06 MED ORDER — DOXYCYCLINE HYCLATE 100 MG PO CAPS: 100.0000 mg | ORAL_CAPSULE | Freq: Two times a day (BID) | ORAL | 0 refills | Status: DC

## 2017-07-06 MED ORDER — IBUPROFEN 400 MG PO TABS
ORAL_TABLET | ORAL | Status: AC
  Filled 2017-07-06: qty 1

## 2017-07-06 MED ORDER — KETOROLAC TROMETHAMINE 30 MG/ML IJ SOLN
30.0000 mg | Freq: Once | INTRAMUSCULAR | Status: AC
  Administered 2017-07-06: 30 mg via INTRAVENOUS
  Filled 2017-07-06: qty 1

## 2017-07-06 MED ORDER — CEFTRIAXONE SODIUM 1 G IJ SOLR
1.0000 g | Freq: Once | INTRAMUSCULAR | Status: AC
  Administered 2017-07-06: 1 g via INTRAVENOUS
  Filled 2017-07-06: qty 10

## 2017-07-06 MED ORDER — SODIUM CHLORIDE 0.9 % IV BOLUS (SEPSIS)
1000.0000 mL | Freq: Once | INTRAVENOUS | Status: AC
  Administered 2017-07-06: 1000 mL via INTRAVENOUS

## 2017-07-06 NOTE — Discharge Instructions (Signed)
Return here as needed.  Follow up with her GYN doctor

## 2017-07-07 ENCOUNTER — Ambulatory Visit: Payer: 59 | Attending: Geriatric Medicine | Admitting: Physical Therapy

## 2017-07-07 DIAGNOSIS — R42 Dizziness and giddiness: Secondary | ICD-10-CM | POA: Diagnosis not present

## 2017-07-07 NOTE — ED Provider Notes (Signed)
[facility]-EMERGENCY DEPT Provider Note   CSN: [id number] Arrival date & time: 07/05/17  2314     History   Chief Complaint Chief Complaint  Patient presents with  . Abdominal Pain    HPI Jasmine Mckenzie is a 36 y.o. female.  HPI Patient presents to the emergency department with lower abdominal cramping that started 2 hours ago.  She also has a bloated feeling.  She states that she felt like the pain is increasing and that is what brought her to the emergency department.  She states that she did not take any medications prior to arrival. The patient denies chest pain, shortness of breath, headache,blurred vision, neck pain, fever, cough, weakness, numbness, dizziness, anorexia, edema,  nausea, vomiting, diarrhea, rash, back pain, dysuria, hematemesis, bloody stool, near syncope, or syncope.  Patient states she may have some slight vaginal discharge.  Patient states that does not seem abnormal to her.  She also denies vaginal bleeding Past Medical History:  Diagnosis Date  . Arthritis   . GERD (gastroesophageal reflux disease)   . Heart palpitations   . IBS (irritable bowel syndrome)     Patient Active Problem List   Diagnosis Date Noted  . Chronic cholecystitis with calculus 08/17/2016    Past Surgical History:  Procedure Laterality Date  . BREAST SURGERY     augmentation  . CHOLECYSTECTOMY N/A 08/17/2016   Procedure: LAPAROSCOPIC CHOLECYSTECTOMY WITH INTRAOPERATIVE CHOLANGIOGRAM;  Surgeon: Manus RuddMatthew Tsuei, MD;  Location: [facility] OR;  Service: General;  Laterality: N/A;  . UMBILICAL HERNIA REPAIR N/A 08/17/2016   Procedure: HERNIA REPAIR UMBILICAL ADULT;  Surgeon: Manus RuddMatthew Tsuei, MD;  Location: [facility] OR;  Service: General;  Laterality: N/A;    OB History    No data available       Home Medications    Prior to Admission medications   Medication Sig Start Date End Date Taking? Authorizing Provider  doxycycline (VIBRAMYCIN) 100 MG capsule Take 1 capsule (100 mg total) by mouth 2 (two)  times daily. 07/06/17   Naydeline Morace, Cristal Deerhristopher, PA-C  FLUoxetine (PROZAC) 20 MG tablet Take 20 mg by mouth daily.    [provider]  ibuprofen (ADVIL,MOTRIN) 800 MG tablet Take 1 tablet (800 mg total) by mouth every 8 (eight) hours as needed. 07/06/17   Angelli Baruch, Cristal Deerhristopher, PA-C  naproxen (NAPROSYN) 500 MG tablet Take 1 tablet (500 mg total) by mouth 2 (two) times daily as needed. Patient not taking: Reported on 02/14/2017 11/28/16   Sudie GrumblingAmyot, Ann Berry, NP  ondansetron (ZOFRAN) 4 MG tablet Take 1 tablet (4 mg total) by mouth every 6 (six) hours. Patient not taking: Reported on 02/14/2017 08/07/16   Fayrene Helperran, [place], PA-C  traMADol (ULTRAM) 50 MG tablet Take 1 tablet (50 mg total) by mouth every 6 (six) hours as needed for severe pain. 07/06/17   Charlestine NightLawyer, [place] Savini, PA-C    Family History Family History  Problem Relation Age of Onset  . Hypertension Mother   . Diabetes Father   . Hypertension Father     Social History Social History  Substance Use Topics  . Smoking status: Never Smoker  . Smokeless tobacco: Never Used  . Alcohol use Yes     Comment: occasional wine     Allergies   No known allergies   Review of Systems Review of Systems All other systems negative except as documented in the HPI. All pertinent positives and negatives as reviewed in the HPI.  Physical Exam Updated Vital Signs BP 116/71   Pulse 70  Temp 98 F (36.7 C) (Oral)   Resp 18   Ht  (1.549 m)   Wt 59 kg (130 lb)   LMP 06/22/2017   SpO2 99%   BMI 24.56 kg/m   Physical Exam  Constitutional: She is oriented to person, place, and time. She appears well-developed and well-nourished. No distress.  HENT:  Head: Normocephalic and atraumatic.  Mouth/Throat: Oropharynx is clear and moist.  Eyes: Pupils are equal, round, and reactive to light.  Neck: Normal range of motion. Neck supple.  Cardiovascular: Normal rate, regular rhythm and normal heart sounds.  Exam reveals no gallop and no friction rub.     No murmur heard. Pulmonary/Chest: Effort normal and breath sounds normal. No respiratory distress. She has no wheezes.  Abdominal: Soft. Bowel sounds are normal. She exhibits no distension. There is no tenderness.  Neurological: She is alert and oriented to person, place, and time. She exhibits normal muscle tone. Coordination normal.  Skin: Skin is warm and dry. Capillary refill takes less than 2 seconds. No rash noted. No erythema.  Psychiatric: She has a normal mood and affect. Her behavior is normal.  Nursing note and vitals reviewed.    ED Treatments / Results  Labs (all labs ordered are listed, but only abnormal results are displayed) Labs Reviewed  COMPREHENSIVE METABOLIC PANEL - Abnormal; Notable for the following:       Result Value   Calcium 8.7 (*)    All other components within normal limits  CBC - Abnormal; Notable for the following:    Hemoglobin 11.8 (*)    HCT 35.3 (*)    All other components within normal limits  URINALYSIS, ROUTINE W REFLEX MICROSCOPIC - Abnormal; Notable for the following:    APPearance HAZY (*)    All other components within normal limits  LIPASE, BLOOD  POC URINE PREG, ED    EKG  EKG Interpretation None       Radiology US Pelvis Transvanginal Non-ob (tv Only)  Result Date: 07/06/2017 CLINICAL DATA:  Pelvic pain. EXAM: TRANSABDOMINAL AND TRANSVAGINAL ULTRASOUND OF PELVIS DOPPLER ULTRASOUND OF OVARIES TECHNIQUE: Both transabdominal and transvaginal ultrasound examinations of the pelvis were performed. Transabdominal technique was performed for global imaging of the pelvis including uterus, ovaries, adnexal regions, and pelvic cul-de-sac. It was necessary to proceed with endovaginal exam following the transabdominal exam to visualize the ovaries and endometrium. Color and duplex Doppler ultrasound was utilized to evaluate blood flow to the ovaries. COMPARISON:  None. FINDINGS: Uterus Measurements: 9.6 x 5.3 x 5.3 cm. Uterus is anteverted and  mildly retroflexed. No fibroids or other mass visualized. Endometrium Thickness: 5 mm.  No focal abnormality visualized. Right ovary Measurements: 3.5 x 2.1 x 3.3 cm. Normal follicular cystic changes. No abnormal adnexal masses. Left ovary Measurements: 3.4 x 1.5 x 3.8 cm. Normal follicular cystic changes. Solid-appearing mildly echogenic area measuring 3.3 cm maximal diameter. This could represent a hemorrhagic cyst or possibly teratoma. Follow-up examination in 6-12 weeks is suggested. Pulsed Doppler evaluation of both ovaries demonstrates normal low-resistance arterial and venous waveforms. Other findings Minimal free fluid in the pelvis. IMPRESSION: 3.3 cm diameter non cystic focus in the left ovary probably represents hemorrhagic cyst. Suggest follow-up examination in 6-12 weeks. No evidence of ovarian torsion. Uterus and endometrium are normal. Electronically Signed   By: Burman Nieves M.D.   On: 07/06/2017 05:04   US Pelvis Complete  Result Date: 07/06/2017 CLINICAL DATA:  Pelvic pain. EXAM: TRANSABDOMINAL AND TRANSVAGINAL ULTRASOUND OF PELVIS DOPPLER  ULTRASOUND OF OVARIES TECHNIQUE: Both transabdominal and transvaginal ultrasound examinations of the pelvis were performed. Transabdominal technique was performed for global imaging of the pelvis including uterus, ovaries, adnexal regions, and pelvic cul-de-sac. It was necessary to proceed with endovaginal exam following the transabdominal exam to visualize the ovaries and endometrium. Color and duplex Doppler ultrasound was utilized to evaluate blood flow to the ovaries. COMPARISON:  None. FINDINGS: Uterus Measurements: 9.6 x 5.3 x 5.3 cm. Uterus is anteverted and mildly retroflexed. No fibroids or other mass visualized. Endometrium Thickness: 5 mm.  No focal abnormality visualized. Right ovary Measurements: 3.5 x 2.1 x 3.3 cm. Normal follicular cystic changes. No abnormal adnexal masses. Left ovary Measurements: 3.4 x 1.5 x 3.8 cm. Normal follicular  cystic changes. Solid-appearing mildly echogenic area measuring 3.3 cm maximal diameter. This could represent a hemorrhagic cyst or possibly teratoma. Follow-up examination in 6-12 weeks is suggested. Pulsed Doppler evaluation of both ovaries demonstrates normal low-resistance arterial and venous waveforms. Other findings Minimal free fluid in the pelvis. IMPRESSION: 3.3 cm diameter non cystic focus in the left ovary probably represents hemorrhagic cyst. Suggest follow-up examination in 6-12 weeks. No evidence of ovarian torsion. Uterus and endometrium are normal. Electronically Signed   By: Burman Nieves M.D.   On: 07/06/2017 05:04   US Pelvic Doppler (torsion R/o Or Mass Arterial Flow)  Result Date: 07/06/2017 CLINICAL DATA:  Pelvic pain. EXAM: TRANSABDOMINAL AND TRANSVAGINAL ULTRASOUND OF PELVIS DOPPLER ULTRASOUND OF OVARIES TECHNIQUE: Both transabdominal and transvaginal ultrasound examinations of the pelvis were performed. Transabdominal technique was performed for global imaging of the pelvis including uterus, ovaries, adnexal regions, and pelvic cul-de-sac. It was necessary to proceed with endovaginal exam following the transabdominal exam to visualize the ovaries and endometrium. Color and duplex Doppler ultrasound was utilized to evaluate blood flow to the ovaries. COMPARISON:  None. FINDINGS: Uterus Measurements: 9.6 x 5.3 x 5.3 cm. Uterus is anteverted and mildly retroflexed. No fibroids or other mass visualized. Endometrium Thickness: 5 mm.  No focal abnormality visualized. Right ovary Measurements: 3.5 x 2.1 x 3.3 cm. Normal follicular cystic changes. No abnormal adnexal masses. Left ovary Measurements: 3.4 x 1.5 x 3.8 cm. Normal follicular cystic changes. Solid-appearing mildly echogenic area measuring 3.3 cm maximal diameter. This could represent a hemorrhagic cyst or possibly teratoma. Follow-up examination in 6-12 weeks is suggested. Pulsed Doppler evaluation of both ovaries demonstrates  normal low-resistance arterial and venous waveforms. Other findings Minimal free fluid in the pelvis. IMPRESSION: 3.3 cm diameter non cystic focus in the left ovary probably represents hemorrhagic cyst. Suggest follow-up examination in 6-12 weeks. No evidence of ovarian torsion. Uterus and endometrium are normal. Electronically Signed   By: Burman Nieves M.D.   On: 07/06/2017 05:04    Procedures Procedures (including critical care time)  Medications Ordered in ED Medications  ibuprofen (ADVIL,MOTRIN) tablet 400 mg (400 mg Oral Given 07/06/17 0132)  sodium chloride 0.9 % bolus 1,000 mL (0 mLs Intravenous Stopped 07/06/17 0608)  cefTRIAXone (ROCEPHIN) 1 g in dextrose 5 % 50 mL IVPB (0 g Intravenous Stopped 07/06/17 0707)  ketorolac (TORADOL) 30 MG/ML injection 30 mg (30 mg Intravenous Given 07/06/17 0608)     Initial Impression / Assessment and Plan / ED Course  I have reviewed the triage vital signs and the nursing notes.  Pertinent labs & imaging results that were available during my care of the patient were reviewed by me and considered in my medical decision making (see chart for details).  Patient be treated for the ovarian cysts as the possible cause for the pain.  Also gave antibiotics for possible PID.  Patient is advised plan and all questions were answered.  Also advised her to follow-up with her GYN doctor for further evaluation and care Final Clinical Impressions(s) / ED Diagnoses   Final diagnoses:  Cyst of left ovary  Pelvic pain in female    New Prescriptions Discharge Medication List as of 07/06/2017  6:56 AM    START taking these medications   Details  doxycycline (VIBRAMYCIN) 100 MG capsule Take 1 capsule (100 mg total) by mouth 2 (two) times daily., Starting Wed 07/06/2017, Print    ibuprofen (ADVIL,MOTRIN) 800 MG tablet Take 1 tablet (800 mg total) by mouth every 8 (eight) hours as needed., Starting Wed 07/06/2017, Print    traMADol (ULTRAM) 50 MG tablet Take 1 tablet  (50 mg total) by mouth every 6 (six) hours as needed for severe pain., Starting Wed 07/06/2017, Print         Jashaun Penrose, [place], PA-C 07/07/17 1610    Gerhard Munch, MD 07/10/17 0040

## 2017-07-07 NOTE — Therapy (Addendum)
[facility] [address] [place], [place], [address] Phone: [phone]   Fax:  [phone]  Physical Therapy Evaluation/Discharge  Patient Details  Name: Jasmine Mckenzie MRN: [id number] Date of Birth: 14-Feb-1981 Referring Provider: Lajean Manes, MD  Encounter Date: 07/07/2017      PT End of Session - 07/07/17 1436    Visit Number 1   Number of Visits 3   Date for PT Re-Evaluation 08/18/17   Authorization Type [employer] 15% coinsurance   Authorization - Number of Visits 60   PT Start Time 1400   PT Stop Time 1432   PT Time Calculation (min) 32 min   Activity Tolerance Patient tolerated treatment well   Behavior During Therapy [facility] for tasks assessed/performed      Past Medical History:  Diagnosis Date  . Arthritis   . GERD (gastroesophageal reflux disease)   . Heart palpitations   . IBS (irritable bowel syndrome)     Past Surgical History:  Procedure Laterality Date  . BREAST SURGERY     augmentation  . CHOLECYSTECTOMY N/A 08/17/2016   Procedure: LAPAROSCOPIC CHOLECYSTECTOMY WITH INTRAOPERATIVE CHOLANGIOGRAM;  Surgeon: Donnie Mesa, MD;  Location: [facility];  Service: General;  Laterality: N/A;  . UMBILICAL HERNIA REPAIR N/A 08/17/2016   Procedure: HERNIA REPAIR UMBILICAL ADULT;  Surgeon: Donnie Mesa, MD;  Location: [facility];  Service: General;  Laterality: N/A;    There were no vitals filed for this visit.       Subjective Assessment - 07/07/17 1403    Subjective Pt is a 36 y/o female who presents to OPPT for dizziness and vertigo.  Pt reports symptoms began ~ 1-2 years ago, and reports progressive increase in symptoms.  Pt concerned that symptoms may be related to her work - she states "walking in circles" throughout the morning then remains standing throughout the day.     Patient Stated Goals improve dizziness and balance   Currently in Pain? Yes  occasional headaches            [facility] PT Assessment -  07/07/17 1405      Assessment   Medical Diagnosis BPPV   Referring Provider Lajean Manes, MD   Onset Date/Surgical Date --  1-2 years   Hand Dominance Right   Next MD Visit PRN   Prior Therapy n/a     Precautions   Precautions None     Restrictions   Weight Bearing Restrictions No     Balance Screen   Has the patient fallen in the past 6 months No  feels off balance-reports occasional near falls   Has the patient had a decrease in activity level because of a fear of falling?  No   Is the patient reluctant to leave their home because of a fear of falling?  No     Home Environment   Living Environment Private residence   Living Arrangements Non-relatives/Friends  husband is moving from [place]     Prior Function   Level of Independence Independent   Vocation Full time employment   Vocation Requirements [employer] - standing all day; window clerk; sorting mail and packages   Leisure go out with friends; spend time with 4 children (currently living with father)     Cognition   Overall Cognitive Status Within Functional Limits for tasks assessed     Observation/Other Assessments   Focus on Therapeutic Outcomes (FOTO)  73 (27% limited; predicted 11% limited)  Dizziness Handicap Inventory (DHI)  42 (moderate)     Ambulation/Gait   Gait Comments amb independently in clinic without any significant deviations            Vestibular Assessment - 07/07/17 1412      Vestibular Assessment   General Observation symptoms at rest 5/10     Symptom Behavior   Type of Dizziness Spinning   Frequency of Dizziness at least 1x/day   Duration of Dizziness seconds to minutes   Aggravating Factors Forward bending;Supine to sit   Relieving Factors No known relieving factors  sit down and rest     Occulomotor Exam   Occulomotor Alignment Normal   Spontaneous Absent   Gaze-induced Absent   Smooth Pursuits Intact   Saccades Intact   Comment pt reports increased  symptoms with smooth pursuits and Lt saccades     Vestibulo-Occular Reflex   VOR 1 Head Only (x 1 viewing) WNL   Comment mild increase in symptoms with VOR and HIT - both negative     Positional Testing   Dix-Hallpike Dix-Hallpike Right;Dix-Hallpike Left   Horizontal Canal Testing Horizontal Canal Right;Horizontal Canal Left     Dix-Hallpike Right   Dix-Hallpike Right Duration none   Dix-Hallpike Right Symptoms No nystagmus     Dix-Hallpike Left   Dix-Hallpike Left Duration none   Dix-Hallpike Left Symptoms No nystagmus     Horizontal Canal Right   Horizontal Canal Right Duration none   Horizontal Canal Right Symptoms Normal     Horizontal Canal Left   Horizontal Canal Left Duration none   Horizontal Canal Left Symptoms Normal        Objective measurements completed on examination: See above findings.           Vestibular Treatment/Exercise - 07/07/17 1434      Vestibular Treatment/Exercise   Vestibular Treatment Provided Gaze   Gaze Exercises X1 Viewing Horizontal;X1 Viewing Vertical     X1 Viewing Horizontal   Foot Position seated   Time --  30 sec   Reps 1   Comments mild increase in symptoms     X1 Viewing Vertical   Foot Position seated   Time --  30 seconds   Reps 1   Comments reports vertical more symptomatic than horizontal               PT Education - 07/07/17 1436    Education provided Yes   Education Details HEP   Person(s) Educated Patient;Other (comment)  friend   Methods Explanation;Demonstration;Handout   Comprehension Verbalized understanding;Returned demonstration;Need further instruction             PT Long Term Goals - 07/07/17 1439      PT LONG TERM GOAL #1   Title independent with HEP   Time 6   Period Weeks   Status New   Target Date 08/18/17     PT LONG TERM GOAL #2   Title improve DHI to < 30 for improved symptoms and function   Time 6   Period Weeks   Status New   Target Date 08/18/17     PT  LONG TERM GOAL #3   Title perform FGA with goal to be written if indicated   Time 6   Period Weeks   Status New   Target Date 08/18/17     PT LONG TERM GOAL #4   Title report 50% improvement in vestibular symptoms for improved function and mobility   Time 6  Period Weeks   Status New   Target Date 08/18/17                Plan - 07/07/17 1437    Clinical Impression Statement Pt is a 36 y/o female who presents to OPPT for vertigo and dizziness.  Positional testing negative for BPPV, and pt with slight increase in symptoms with VOR and all oculomotor testing, so initiated gaze x1 today with increase in symptoms worse with vertical.  Will plan to further assess balance and SOT next visit if indicated.  Will benefit from PT to improve dizziness and functional mobility.   Clinical Presentation Stable   Clinical Decision Making Low   Rehab Potential Good   PT Frequency Biweekly  recommend 1x/wk but pt requesting biweekly   PT Duration 6 weeks   PT Treatment/Interventions ADLs/Self Care Home Management;Canalith Repostioning;Neuromuscular re-education;Balance training;Therapeutic exercise;Therapeutic activities;Functional mobility training;Stair training;Gait training;Patient/family education;Vestibular   PT Next Visit Plan review gaze and progress PRN, perform FGA/add corner balance exercises; ? SOT   Consulted and Agree with Plan of Care Patient      Patient will benefit from skilled therapeutic intervention in order to improve the following deficits and impairments:  Decreased balance, Dizziness  Visit Diagnosis: Dizziness and giddiness - Plan: PT plan of care cert/re-cert     Problem List Patient Active Problem List   Diagnosis Date Noted  . Chronic cholecystitis with calculus 08/17/2016      Laureen Abrahams, PT, DPT 07/07/17 2:43 PM     [facility] [address] [place], [place],  [address] Phone: [phone]   Fax:  [phone]  Name: Jasmine Mckenzie MRN: [id number] Date of Birth: 11-22-1980      PHYSICAL THERAPY DISCHARGE SUMMARY  Visits from Start of Care: 1  Current functional level related to goals / functional outcomes: See above; pt no showed x 2   Remaining deficits: unknown   Education / Equipment: HEP  Plan: Patient agrees to discharge.  Patient goals were not met. Patient is being discharged due to not returning since the last visit.  ?????     Laureen Abrahams, PT, DPT 08/15/17 2:47 PM  [facility] [address] [place], [place] [address]  [phone] (office) [phone] (fax)

## 2017-07-07 NOTE — Patient Instructions (Signed)
Gaze Stabilization: Sitting    Keeping eyes on target on wall 3-5 feet away, and move head side to side for _30-60___ seconds. Repeat while moving head up and down for __30-60__ seconds. Do __2-3__ sessions per day.  Copyright  VHI. All rights reserved.   Gaze Stabilization: Tip Card  1.Target must remain in focus, not blurry, and appear stationary while head is in motion. 2.Perform exercises with small head movements (45 to either side of midline). 3.Increase speed of head motion so long as target is in focus. 4.If you wear eyeglasses, be sure you can see target through lens (therapist will give specific instructions for bifocal / progressive lenses). 5.These exercises may provoke dizziness or nausea. Work through these symptoms. If too dizzy, slow head movement slightly. Rest between each exercise. 6.Exercises demand concentration; avoid distractions.  Copyright  VHI. All rights reserved.     

## 2017-07-18 ENCOUNTER — Ambulatory Visit: Payer: 59 | Admitting: Physical Therapy

## 2017-08-01 ENCOUNTER — Ambulatory Visit: Payer: 59 | Attending: Geriatric Medicine | Admitting: Physical Therapy

## 2017-09-12 ENCOUNTER — Other Ambulatory Visit: Payer: Self-pay | Admitting: Physician Assistant

## 2017-09-12 DIAGNOSIS — R1084 Generalized abdominal pain: Secondary | ICD-10-CM

## 2017-09-15 ENCOUNTER — Other Ambulatory Visit: Payer: Self-pay | Admitting: Obstetrics & Gynecology

## 2017-09-16 ENCOUNTER — Ambulatory Visit
Admission: RE | Admit: 2017-09-16 | Discharge: 2017-09-16 | Disposition: A | Discharge status: Home / Self Care | Payer: 59 | Source: Ambulatory Visit | Attending: Physician Assistant | Admitting: Physician Assistant

## 2017-09-16 ENCOUNTER — Other Ambulatory Visit: Payer: 59

## 2017-09-16 DIAGNOSIS — R1084 Generalized abdominal pain: Secondary | ICD-10-CM

## 2017-09-16 MED ORDER — IOPAMIDOL (ISOVUE-300) INJECTION 61%
100.0000 mL | Freq: Once | INTRAVENOUS | Status: AC | PRN
  Administered 2017-09-16: 100 mL via INTRAVENOUS

## 2017-12-15 ENCOUNTER — Other Ambulatory Visit: Payer: Self-pay

## 2017-12-15 ENCOUNTER — Ambulatory Visit (HOSPITAL_COMMUNITY)
Admission: EM | Admit: 2017-12-15 | Discharge: 2017-12-15 | Disposition: A | Discharge status: Home / Self Care | Payer: 59 | Attending: Internal Medicine | Admitting: Internal Medicine

## 2017-12-15 ENCOUNTER — Encounter (HOSPITAL_COMMUNITY): Payer: Self-pay | Admitting: Emergency Medicine

## 2017-12-15 ENCOUNTER — Ambulatory Visit (INDEPENDENT_AMBULATORY_CARE_PROVIDER_SITE_OTHER): Payer: 59

## 2017-12-15 DIAGNOSIS — R140 Abdominal distension (gaseous): Secondary | ICD-10-CM

## 2017-12-15 DIAGNOSIS — K5901 Slow transit constipation: Secondary | ICD-10-CM | POA: Diagnosis not present

## 2017-12-15 LAB — POCT URINALYSIS DIP (DEVICE)
BILIRUBIN URINE: NEGATIVE
Glucose, UA: NEGATIVE mg/dL
HGB URINE DIPSTICK: NEGATIVE
Ketones, ur: NEGATIVE mg/dL
LEUKOCYTES UA: NEGATIVE
Nitrite: NEGATIVE
PH: 7 (ref 5.0–8.0)
Protein, ur: NEGATIVE mg/dL
SPECIFIC GRAVITY, URINE: 1.015 (ref 1.005–1.030)
Urobilinogen, UA: 0.2 mg/dL (ref 0.0–1.0)

## 2017-12-15 LAB — POCT PREGNANCY, URINE: Preg Test, Ur: NEGATIVE

## 2017-12-15 MED ORDER — POLYETHYLENE GLYCOL 3350 17 GM/SCOOP PO POWD: 17.0000 g | Freq: Every day | ORAL | 0 refills | Status: DC

## 2017-12-15 MED ORDER — RANITIDINE HCL 150 MG PO CAPS: 150.0000 mg | ORAL_CAPSULE | Freq: Every day | ORAL | 0 refills | Status: DC

## 2017-12-15 NOTE — ED Provider Notes (Signed)
[facility]    CSN: [id number] Arrival date & time: 12/15/17  1834     History   Chief Complaint Chief Complaint  Patient presents with  . Abdominal Pain    HPI Jasmine Mckenzie is a 37 y.o. female.   Channah presents with s/o with complaints of abdominal pain and bloating which has been ongoing for the past three days. Without nausea or vomiting. Last BM was this morning. Denies having to strain to pass. Denies urinary symptoms. Mildly similar in the past but not as severe. Eating and drinking, when she eats she states it "feels like it just sits there." pain is to entire abdomen. Without fevers. Denies urinary symptoms. Rates pain 8/10. Hx of gerd and ibs. Does not take any medications daily.    ROS per HPI.       Past Medical History:  Diagnosis Date  . Arthritis   . GERD (gastroesophageal reflux disease)   . Heart palpitations   . IBS (irritable bowel syndrome)     Patient Active Problem List   Diagnosis Date Noted  . Chronic cholecystitis with calculus 08/17/2016    Past Surgical History:  Procedure Laterality Date  . BREAST SURGERY     augmentation  . CHOLECYSTECTOMY N/A 08/17/2016   Procedure: LAPAROSCOPIC CHOLECYSTECTOMY WITH INTRAOPERATIVE CHOLANGIOGRAM;  Surgeon: Manus Rudd, MD;  [facility];  Service: General;  Laterality: N/A;  . UMBILICAL HERNIA REPAIR N/A 08/17/2016   Procedure: HERNIA REPAIR UMBILICAL ADULT;  Surgeon: Manus Rudd, MD;  [facility];  Service: General;  Laterality: N/A;    OB History    No data available       Home Medications    Prior to Admission medications   Medication Sig Start Date End Date Taking? Authorizing Provider  doxycycline (VIBRAMYCIN) 100 MG capsule Take 1 capsule (100 mg total) by mouth 2 (two) times daily. 07/06/17   Lawyer, Cristal Deer, PA-C  FLUoxetine (PROZAC) 20 MG tablet Take 20 mg by mouth daily.    [provider]  ibuprofen (ADVIL,MOTRIN) 800 MG tablet Take 1 tablet  (800 mg total) by mouth every 8 (eight) hours as needed. 07/06/17   Lawyer, Cristal Deer, PA-C  naproxen (NAPROSYN) 500 MG tablet Take 1 tablet (500 mg total) by mouth 2 (two) times daily as needed. 11/28/16   Sudie Grumbling, NP  ondansetron (ZOFRAN) 4 MG tablet Take 1 tablet (4 mg total) by mouth every 6 (six) hours. Patient not taking: Reported on 07/07/2017 08/07/16   Fayrene Helper, PA-C  polyethylene glycol powder (GLYCOLAX/MIRALAX) powder Take 17 g by mouth daily. 12/15/17   Georgetta Haber, NP  traMADol (ULTRAM) 50 MG tablet Take 1 tablet (50 mg total) by mouth every 6 (six) hours as needed for severe pain. 07/06/17   Charlestine Night, PA-C    Family History Family History  Problem Relation Age of Onset  . Hypertension Mother   . Diabetes Father   . Hypertension Father     Social History Social History   Tobacco Use  . Smoking status: Never Smoker  . Smokeless tobacco: Never Used  Substance Use Topics  . Alcohol use: Yes    Comment: occasional wine  . Drug use: No     Allergies   No known allergies   Review of Systems Review of Systems   Physical Exam Triage Vital Signs ED Triage Vitals  Enc Vitals Group     BP 12/15/17 1930 128/86     Pulse Rate 12/15/17 1930  77     Resp 12/15/17 1930 18     Temp 12/15/17 1930 98.4 F (36.9 C)     Temp Source 12/15/17 1930 Oral     SpO2 12/15/17 1930 100 %     Weight --      Height --      Head Circumference --      Peak Flow --      Pain Score 12/15/17 1928 8     Pain Loc --      Pain Edu? --      Excl. in GC? --    No data found.  Updated Vital Signs BP 128/86 (BP Location: Left Arm)   Pulse 77   Temp 98.4 F (36.9 C) (Oral)   Resp 18   LMP 11/21/2017   SpO2 100%   Visual Acuity Right Eye Distance:   Left Eye Distance:   Bilateral Distance:    Right Eye Near:   Left Eye Near:    Bilateral Near:     Physical Exam  Constitutional: She is oriented to person, place, and time. She appears well-developed and  well-nourished. No distress.  Cardiovascular: Normal rate, regular rhythm and normal heart sounds.  Pulmonary/Chest: Effort normal and breath sounds normal.  Abdominal: Soft. Bowel sounds are normal. She exhibits distension. There is generalized tenderness and tenderness in the epigastric area and left lower quadrant. There is no rigidity, no rebound, no guarding, no CVA tenderness, no tenderness at McBurney's point and negative Murphy's sign.  Neurological: She is alert and oriented to person, place, and time.  Skin: Skin is warm and dry.     UC Treatments / Results  Labs (all labs ordered are listed, but only abnormal results are displayed) Labs Reviewed  POCT URINALYSIS DIP (DEVICE)  POCT PREGNANCY, URINE    EKG  EKG Interpretation None       Radiology Dg Abd 1 View  Result Date: 12/15/2017 CLINICAL DATA:  Two-day history of stomach pain. EXAM: ABDOMEN - 1 VIEW COMPARISON:  CT scan 09/16/2017 FINDINGS: No gaseous small bowel dilatation to suggest obstruction. Air in stool or seen scattered along the length of a mildly distended colon. Surgical clips right upper quadrant suggest prior cholecystectomy. Phleboliths overlie the anatomic pelvis. IMPRESSION: Nonspecific bowel gas pattern with no radiographic evidence of bowel obstruction. Electronically Signed   By: Kennith CenterEric  Mansell M.D.   On: 12/15/2017 20:50    Procedures Procedures (including critical care time)  Medications Ordered in UC Medications - No data to display   Initial Impression / Assessment and Plan / UC Course  I have reviewed the triage vital signs and the nursing notes.  Pertinent labs & imaging results that were available during my care of the patient were reviewed by me and considered in my medical decision making (see chart for details).    Non toxic in appearance. Vitals stable. Non specific abdominal exam with significant bloating. Hx of ibs. Recommended to treat gerd as well as constipation per abdominal  xray which appears to have moderate amount of stool present. Return precautions provided. Patient verbalized understanding and agreeable to plan.    Final Clinical Impressions(s) / UC Diagnoses   Final diagnoses:  Abdominal bloating  Slow transit constipation    ED Discharge Orders        Ordered    polyethylene glycol powder (GLYCOLAX/MIRALAX) powder  Daily     12/15/17 2116       Controlled Substance Prescriptions [place] Controlled Substance Registry consulted?  Not Applicable   Georgetta Haber, NP 12/15/17 2120

## 2017-12-15 NOTE — ED Triage Notes (Signed)
Abdominal pain for 2-3 days.  Generalized abdominal pain.  Last bm was today and normal per patient.  denies urinary symptoms.  C/o bloating -hx of irritable bowel syndrom

## 2017-12-15 NOTE — Discharge Instructions (Signed)
Take dose of miralax tonight with 8oz of water as well as tomorrow morning and night with 8oz of water.  Drink at least 32 oz of water a day.  If develop increased pain, vomiting, fevers or otherwise worsening of symptoms please return to be seen or visit the Ed.

## 2018-02-15 IMAGING — CT CT ABD-PELV W/ CM
3 of 4 series · 12 of 36 positions shown, 18 images · IV contrast (iopamidol)
Comparison: None.

CLINICAL DATA: Abdominal swelling, bloating and nausea. Remote
cholecystectomy.

EXAM:
CT ABDOMEN AND PELVIS WITH CONTRAST
TECHNIQUE: Multidetector CT imaging of the abdomen and pelvis was performed
using the standard protocol following bolus administration of
intravenous contrast.
CONTRAST:  100mL [id number] IOPAMIDOL (RDNX7I-[id number]) INJECTION 61%

[Series 3: abd/pelvis with · axial · 0.70mm/px · z∈[-309,-49]mm · 7 of 70 slices shown, 12 images]
[im 9/70  soft-tissue]
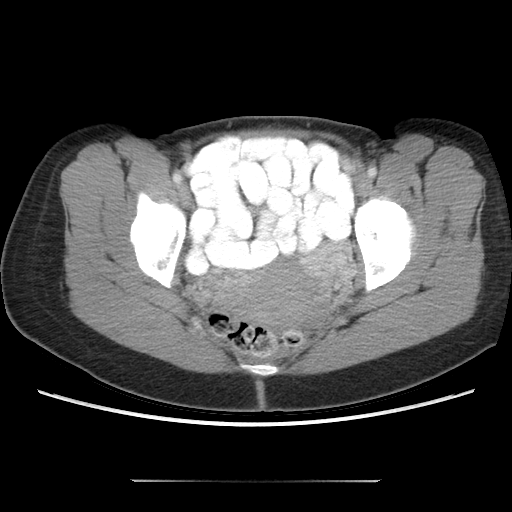
[im 9/70  bone]
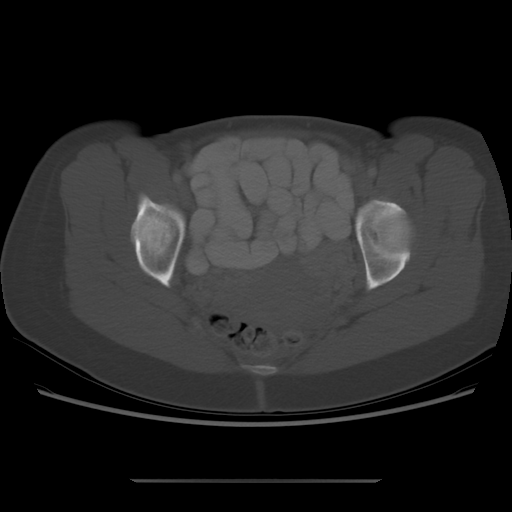
[im 18/70  soft-tissue]
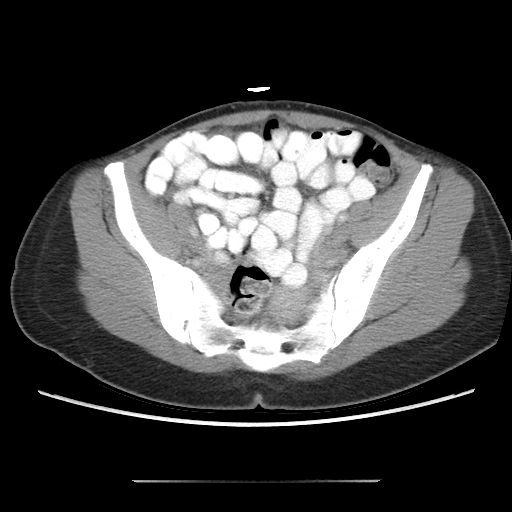
[im 26/70  soft-tissue]
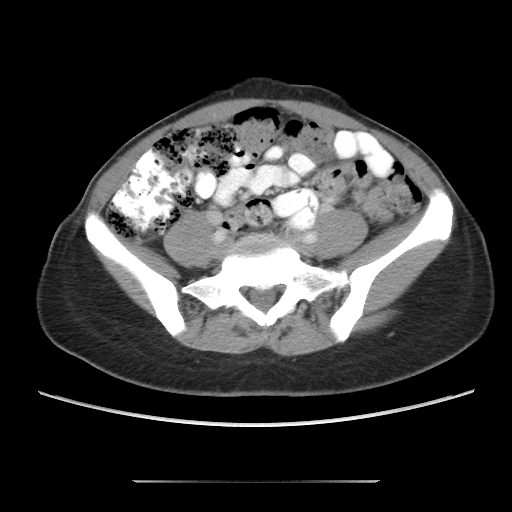
[im 35/70  soft-tissue]
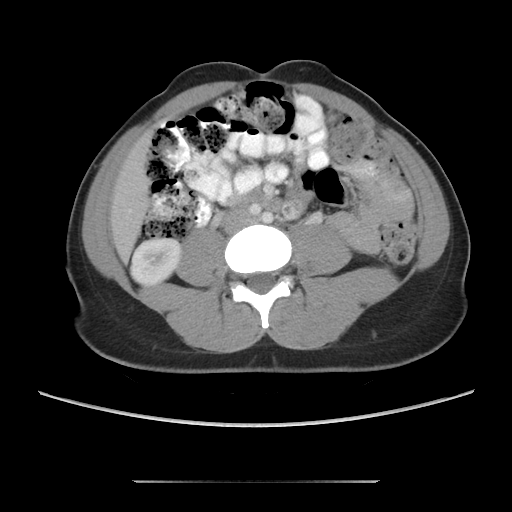
[im 35/70  lung]
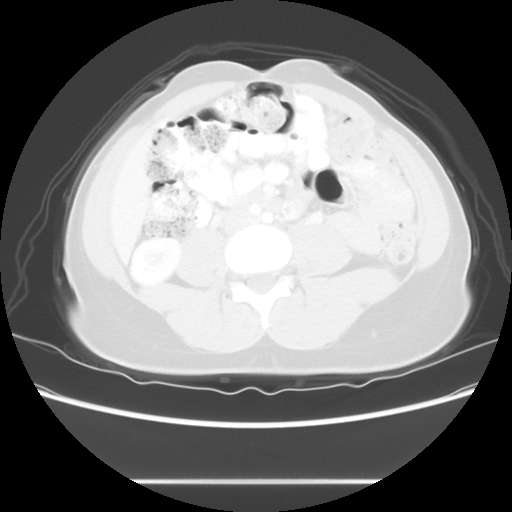
[im 44/70  soft-tissue]
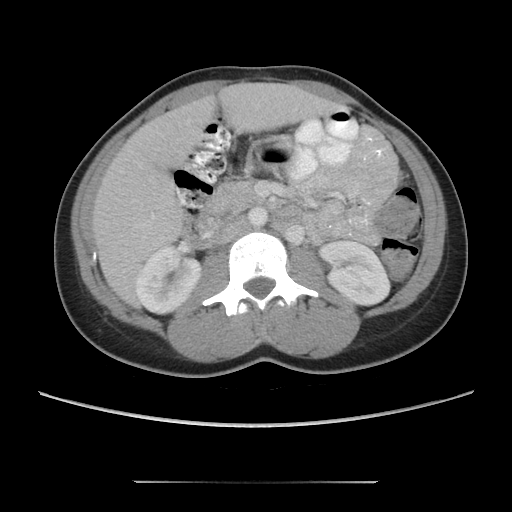
[im 44/70  lung]
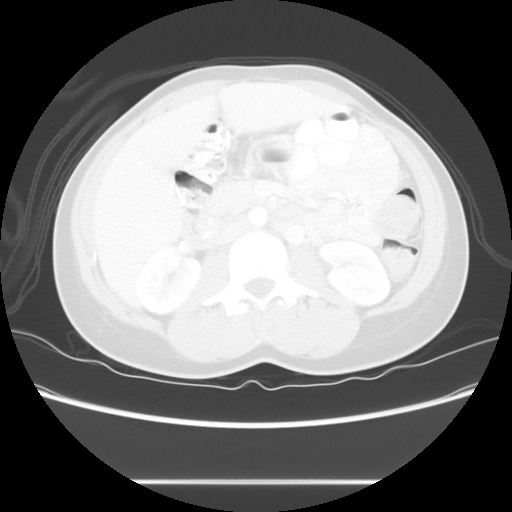
[im 52/70  soft-tissue]
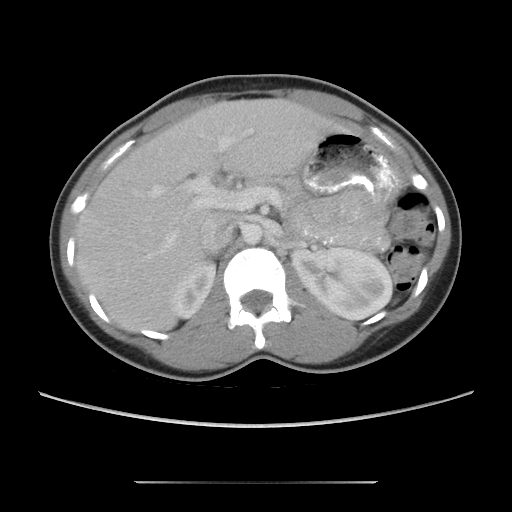
[im 52/70  lung]
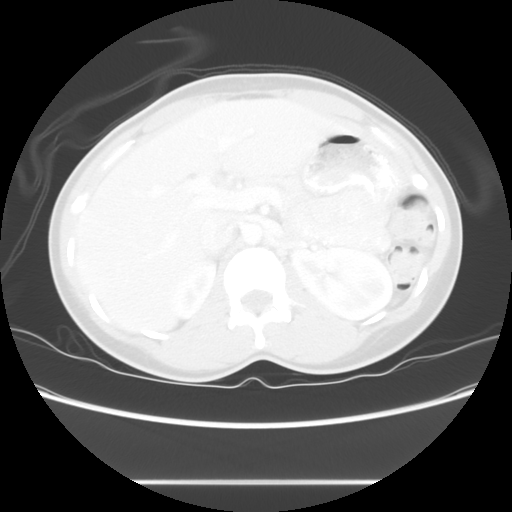
[im 61/70  soft-tissue]
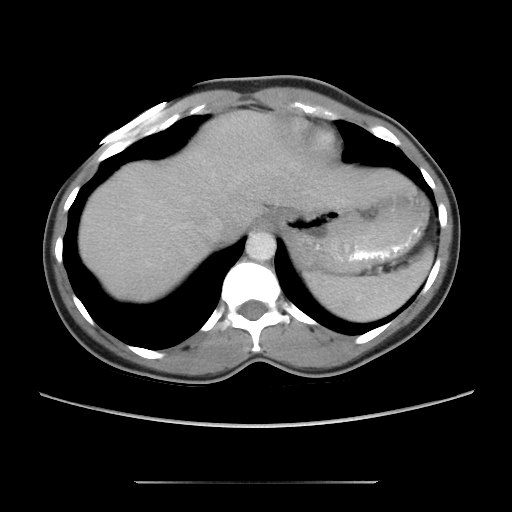
[im 61/70  lung]
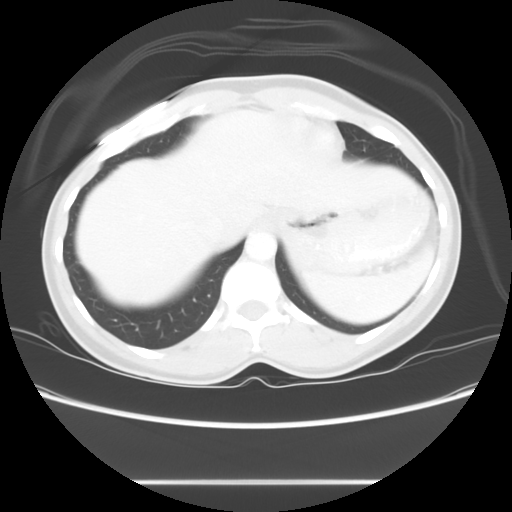

[Series 601: coronal body · coronal · 0.80mm/px · 1 of 107 slices shown, 2 images]
[im 36/107  soft-tissue]
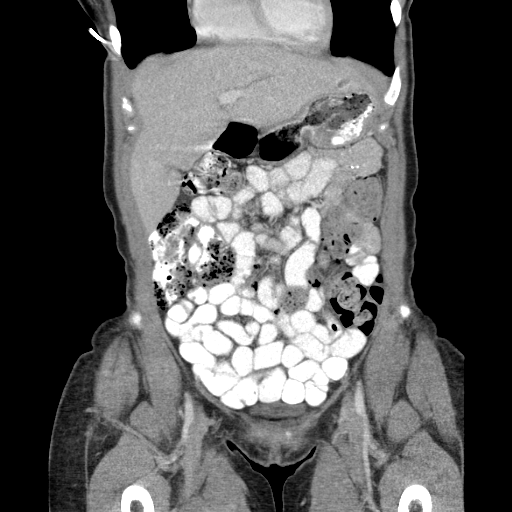
[im 36/107  bone]
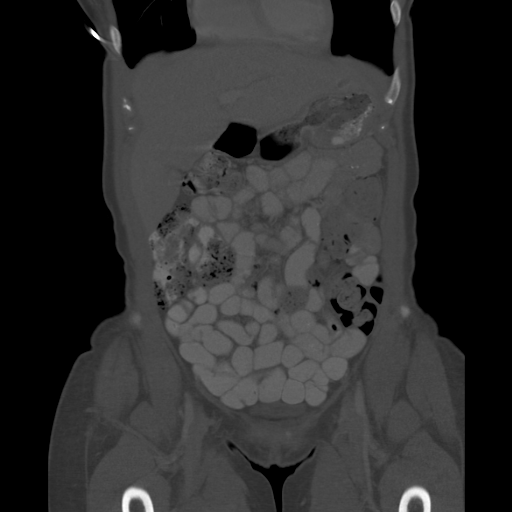

[Series 602: sagittal body · sagittal · 0.80mm/px · 4 of 145 slices shown]
[im 17/145  soft-tissue]
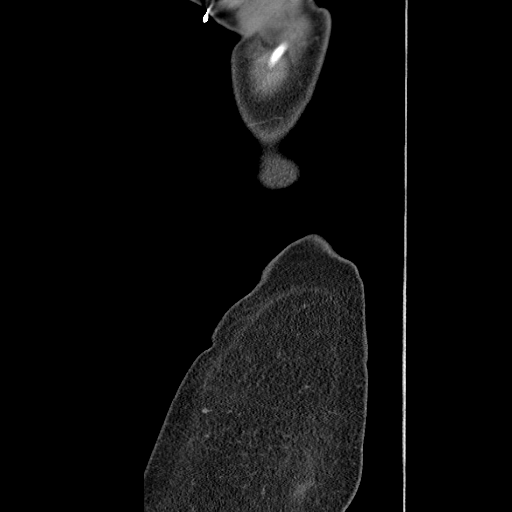
[im 33/145  soft-tissue]
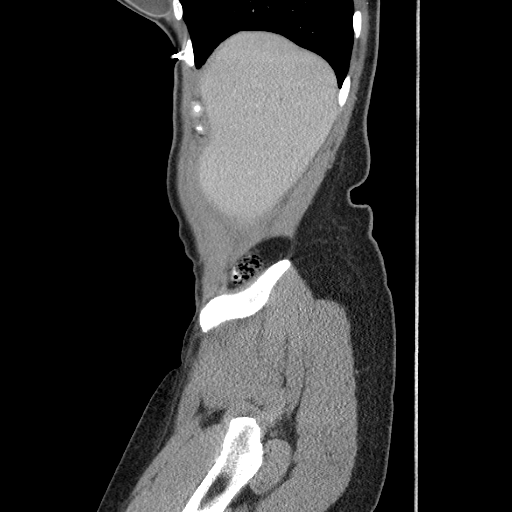
[im 49/145  soft-tissue]
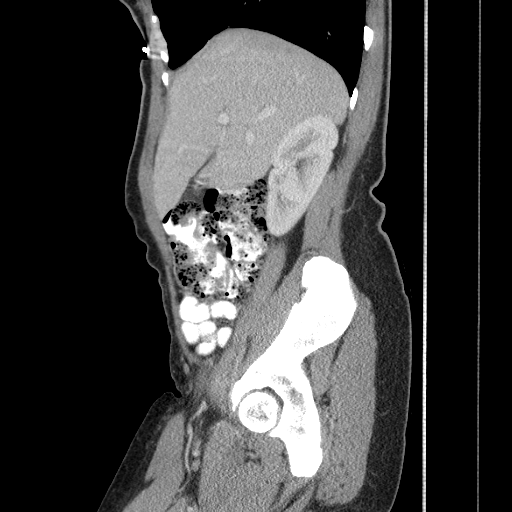
[im 65/145  soft-tissue]
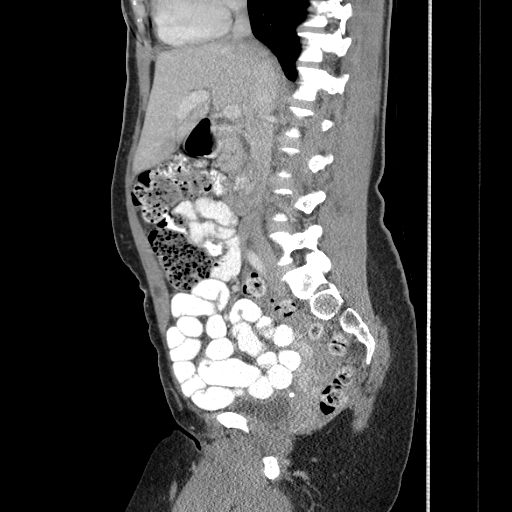

[12 of 36 positions shown; findings below may reference images not displayed]

FINDINGS: Lower chest: No acute abnormality.

Hepatobiliary: Remote cholecystectomy. No biliary dilatation or
obstruction. Small incidental hypoechoic probable cyst in the left
hepatic lobe measures 6 mm, image 14. Patent hepatic and portal
veins.

Pancreas: Unremarkable. No pancreatic ductal dilatation or
surrounding inflammatory changes.

Spleen: Normal in size without focal abnormality.

Adrenals/Urinary Tract: Adrenal glands are unremarkable. Kidneys are
normal, without renal calculi, focal lesion, or hydronephrosis.
Bladder is unremarkable.

Stomach/Bowel: Negative for bowel obstruction, significant
dilatation, ileus, free air. Moderate colonic stool burden. Normal
appendix visualized. No fluid collection or abscess.

Vascular/Lymphatic: No acute vascular finding. Negative for
aneurysm.

Enlarged dilated left ovarian vein with prominent left pelvic
tortuous veins along the uterus. This suggest left ovarian venous
insufficiency which can be seen with pelvic congestion syndrome.

Reproductive: Uterus and bilateral adnexa are unremarkable.

Other: No abdominal wall hernia or abnormality. No abdominopelvic
ascites.

Musculoskeletal: No acute or significant osseous findings.
IMPRESSION: No acute intra-abdominal or pelvic finding by CT.

Remote cholecystectomy

Moderate colonic stool burden, nonspecific

No acute inflammatory process or abscess.

Dilated left ovarian vein and enlarged left hemipelvis veins
suggesting left ovarian venous insufficiency which can be seen with
pelvic congestion syndrome.

## 2018-03-12 ENCOUNTER — Inpatient Hospital Stay (HOSPITAL_COMMUNITY)
Admission: AD | Admit: 2018-03-12 | Discharge: 2018-03-12 | Disposition: A | Discharge status: Home / Self Care | Payer: 59 | Source: Ambulatory Visit | Attending: Obstetrics & Gynecology | Admitting: Obstetrics & Gynecology

## 2018-03-12 ENCOUNTER — Inpatient Hospital Stay (HOSPITAL_COMMUNITY): Payer: 59

## 2018-03-12 ENCOUNTER — Other Ambulatory Visit: Payer: Self-pay

## 2018-03-12 ENCOUNTER — Encounter (HOSPITAL_COMMUNITY): Payer: Self-pay | Admitting: *Deleted

## 2018-03-12 DIAGNOSIS — O99611 Diseases of the digestive system complicating pregnancy, first trimester: Secondary | ICD-10-CM | POA: Diagnosis not present

## 2018-03-12 DIAGNOSIS — O09521 Supervision of elderly multigravida, first trimester: Secondary | ICD-10-CM | POA: Diagnosis present

## 2018-03-12 DIAGNOSIS — R109 Unspecified abdominal pain: Secondary | ICD-10-CM | POA: Diagnosis not present

## 2018-03-12 DIAGNOSIS — O00101 Right tubal pregnancy without intrauterine pregnancy: Secondary | ICD-10-CM

## 2018-03-12 DIAGNOSIS — Z8249 Family history of ischemic heart disease and other diseases of the circulatory system: Secondary | ICD-10-CM | POA: Insufficient documentation

## 2018-03-12 DIAGNOSIS — K589 Irritable bowel syndrome without diarrhea: Secondary | ICD-10-CM | POA: Insufficient documentation

## 2018-03-12 DIAGNOSIS — O26891 Other specified pregnancy related conditions, first trimester: Secondary | ICD-10-CM | POA: Insufficient documentation

## 2018-03-12 DIAGNOSIS — O208 Other hemorrhage in early pregnancy: Secondary | ICD-10-CM

## 2018-03-12 DIAGNOSIS — N854 Malposition of uterus: Secondary | ICD-10-CM | POA: Diagnosis not present

## 2018-03-12 DIAGNOSIS — O34531 Maternal care for retroversion of gravid uterus, first trimester: Secondary | ICD-10-CM | POA: Diagnosis not present

## 2018-03-12 DIAGNOSIS — O209 Hemorrhage in early pregnancy, unspecified: Secondary | ICD-10-CM | POA: Insufficient documentation

## 2018-03-12 DIAGNOSIS — K219 Gastro-esophageal reflux disease without esophagitis: Secondary | ICD-10-CM | POA: Insufficient documentation

## 2018-03-12 DIAGNOSIS — O009 Unspecified ectopic pregnancy without intrauterine pregnancy: Secondary | ICD-10-CM | POA: Diagnosis present

## 2018-03-12 DIAGNOSIS — Z79899 Other long term (current) drug therapy: Secondary | ICD-10-CM | POA: Insufficient documentation

## 2018-03-12 LAB — CBC WITH DIFFERENTIAL/PLATELET
BASOS PCT: 0 %
Basophils Absolute: 0 10*3/uL (ref 0.0–0.1)
Eosinophils Absolute: 0.1 10*3/uL (ref 0.0–0.7)
Eosinophils Relative: 2 %
HEMATOCRIT: 37.3 % (ref 36.0–46.0)
HEMOGLOBIN: 12.7 g/dL (ref 12.0–15.0)
LYMPHS ABS: 2.4 10*3/uL (ref 0.7–4.0)
Lymphocytes Relative: 42 %
MCH: 30.9 pg (ref 26.0–34.0)
MCHC: 34 g/dL (ref 30.0–36.0)
MCV: 90.8 fL (ref 78.0–100.0)
MONOS PCT: 9 %
Monocytes Absolute: 0.5 10*3/uL (ref 0.1–1.0)
NEUTROS ABS: 2.7 10*3/uL (ref 1.7–7.7)
NEUTROS PCT: 47 %
Platelets: 289 10*3/uL (ref 150–400)
RBC: 4.11 MIL/uL (ref 3.87–5.11)
RDW: 12.3 % (ref 11.5–15.5)
WBC: 5.8 10*3/uL (ref 4.0–10.5)

## 2018-03-12 LAB — WET PREP, GENITAL
CLUE CELLS WET PREP: NONE SEEN
Sperm: NONE SEEN
Trich, Wet Prep: NONE SEEN
Yeast Wet Prep HPF POC: NONE SEEN

## 2018-03-12 LAB — URINALYSIS, ROUTINE W REFLEX MICROSCOPIC
BILIRUBIN URINE: NEGATIVE
GLUCOSE, UA: NEGATIVE mg/dL
KETONES UR: NEGATIVE mg/dL
LEUKOCYTES UA: NEGATIVE
NITRITE: NEGATIVE
PH: 5 (ref 5.0–8.0)
Protein, ur: NEGATIVE mg/dL
Specific Gravity, Urine: 1.019 (ref 1.005–1.030)

## 2018-03-12 LAB — TYPE AND SCREEN
ABO/RH(D): A POS
ANTIBODY SCREEN: NEGATIVE

## 2018-03-12 LAB — HCG, QUANTITATIVE, PREGNANCY: HCG, BETA CHAIN, QUANT, S: 129 m[IU]/mL — AB (ref <5)

## 2018-03-12 LAB — POCT PREGNANCY, URINE: Preg Test, Ur: POSITIVE — AB

## 2018-03-12 LAB — CREATININE, SERUM
Creatinine, Ser: 0.76 mg/dL (ref 0.44–1.00)
GFR calc non Af Amer: >60 mL/min (ref >60)

## 2018-03-12 LAB — AST: AST: 18 U/L (ref 15–41)

## 2018-03-12 LAB — ABO/RH: ABO/RH(D): A POS

## 2018-03-12 LAB — BUN: BUN: 13 mg/dL (ref 6–20)

## 2018-03-12 MED ORDER — ACETAMINOPHEN 500 MG PO TABS
1000.0000 mg | ORAL_TABLET | Freq: Once | ORAL | Status: AC
  Administered 2018-03-12: 1000 mg via ORAL
  Filled 2018-03-12: qty 2

## 2018-03-12 MED ORDER — METHOTREXATE INJECTION FOR WOMEN'S HOSPITAL
50.0000 mg/m2 | Freq: Once | INTRAMUSCULAR | Status: AC
  Administered 2018-03-12: 85 mg via INTRAMUSCULAR
  Filled 2018-03-12: qty 1.7

## 2018-03-12 NOTE — MAU Note (Addendum)
Patient c/o  +vaginal bleeding Like a period LMP 02/02/18 +HPT Started Friday following intercourse  +lower abdominal pain Rating pain  1/10 Has not had to take anything for it Cramping in nature

## 2018-03-12 NOTE — Discharge Instructions (Signed)
You will need to return to MAU for repeat blood pregnancy hormone level on Wednesday 5/15 and Saturday 5/18 at 11:30 AM. Be prepared to wait at least 2 hours from the time blood is drawn until you can speak to a MAU provider about results and next steps.

## 2018-03-12 NOTE — MAU Provider Note (Signed)
History     CSN: [id number]  Arrival date and time: 03/12/18 4098   First Provider Initiated Contact with Patient 03/12/18 1133      Chief Complaint  Patient presents with  . Abdominal Pain  . Vaginal Bleeding   HPI  Ms.  Jasmine Mckenzie is a 37 y.o. year old [id number] female at [redacted]w[redacted]d weeks gestation who presents to MAU reporting VB like a period, (+) HPT following SI on 5/10, lower abdominal cramping (rated 1/10). He has not taken anything for the pain.  Past Medical History:  Diagnosis Date  . Arthritis   . GERD (gastroesophageal reflux disease)   . Heart palpitations   . IBS (irritable bowel syndrome)     Past Surgical History:  Procedure Laterality Date  . BREAST SURGERY     augmentation  . CHOLECYSTECTOMY N/A 08/17/2016   Procedure: LAPAROSCOPIC CHOLECYSTECTOMY WITH INTRAOPERATIVE CHOLANGIOGRAM;  Surgeon: Manus Rudd, MD;  Location: [facility] OR;  Service: General;  Laterality: N/A;  . UMBILICAL HERNIA REPAIR N/A 08/17/2016   Procedure: HERNIA REPAIR UMBILICAL ADULT;  Surgeon: Manus Rudd, MD;  Location: [facility] OR;  Service: General;  Laterality: N/A;    Family History  Problem Relation Age of Onset  . Hypertension Mother   . Diabetes Father   . Hypertension Father     Social History   Tobacco Use  . Smoking status: Never Smoker  . Smokeless tobacco: Never Used  Substance Use Topics  . Alcohol use: Yes    Comment: occasional wine  . Drug use: No    Allergies:  Allergies  Allergen Reactions  . No Known Allergies     Medications Prior to Admission  Medication Sig Dispense Refill Last Dose  . doxycycline (VIBRAMYCIN) 100 MG capsule Take 1 capsule (100 mg total) by mouth 2 (two) times daily. 28 capsule 0 Taking  . FLUoxetine (PROZAC) 20 MG tablet Take 20 mg by mouth daily.   Not Taking  . ibuprofen (ADVIL,MOTRIN) 800 MG tablet Take 1 tablet (800 mg total) by mouth every 8 (eight) hours as needed. 21 tablet 0 Taking  . naproxen (NAPROSYN) 500 MG tablet Take  1 tablet (500 mg total) by mouth 2 (two) times daily as needed. 20 tablet 0 Taking  . ondansetron (ZOFRAN) 4 MG tablet Take 1 tablet (4 mg total) by mouth every 6 (six) hours. (Patient not taking: Reported on 07/07/2017) 12 tablet 0 Not Taking  . polyethylene glycol powder (GLYCOLAX/MIRALAX) powder Take 17 g by mouth daily. 255 g 0   . ranitidine (ZANTAC) 150 MG capsule Take 1 capsule (150 mg total) by mouth daily. 30 capsule 0   . traMADol (ULTRAM) 50 MG tablet Take 1 tablet (50 mg total) by mouth every 6 (six) hours as needed for severe pain. 15 tablet 0 Taking    Review of Systems  Constitutional: Negative.   HENT: Negative.   Eyes: Negative.   Respiratory: Negative.   Cardiovascular: Negative.   Gastrointestinal: Negative.   Endocrine: Negative.   Genitourinary: Positive for vaginal bleeding (like a period, but now dark brown).  Musculoskeletal: Negative.   Skin: Negative.   Allergic/Immunologic: Negative.   Neurological: Negative.   Hematological: Negative.   Psychiatric/Behavioral: Negative.    Physical Exam   Blood pressure 120/87, pulse 82, temperature 98.3 F (36.8 C), temperature source Oral, resp. rate 17, height  (1.575 m), weight 146 lb 1.3 oz (66.3 kg), last menstrual period 02/02/2018, SpO2 100 %.  Physical Exam  Nursing note and vitals  reviewed. Constitutional: She is oriented to person, place, and time. She appears well-developed and well-nourished.  HENT:  Head: Normocephalic and atraumatic.  Eyes: Pupils are equal, round, and reactive to light.  Neck: Normal range of motion.  Cardiovascular: Normal rate, regular rhythm and normal heart sounds.  Respiratory: Effort normal and breath sounds normal.  GI: Soft. Bowel sounds are normal.  Genitourinary:  Genitourinary Comments: Uterus: non-tender, SE: cervix is smooth, pink, no lesions, moderate amt of dark, reddish brown blood with small clots removed with several large cotton swabs -- WP, GC/CT done,  closed/long/firm, no CMT or friability, no adnexal tenderness   Musculoskeletal: Normal range of motion.  Neurological: She is alert and oriented to person, place, and time.  Skin: Skin is warm and dry.  Psychiatric: She has a normal mood and affect. Her behavior is normal. Judgment and thought content normal.    MAU Course  Procedures  MDM [facility] UPT CBC w/Diff ABO/Rh HCG Wet Prep GC/CT -- pending HIV -- pending OB < 14 wks [place] with TV Tylenol 100 mg po --  MTX IM injection  *Consult with Dr. Macon Large @ [phone] - notified of patient's complaints, assessments, lab & U/S results, tx plan MTX - ok to d/c home, agrees with plan  Results for orders placed or performed during the hospital encounter of 03/12/18 (from the past 24 hour(s))  Urinalysis, Routine w reflex microscopic     Status: Abnormal   Collection Time: 03/12/18 10:11 AM  Result Value Ref Range   Color, Urine YELLOW YELLOW   APPearance HAZY (A) CLEAR   Specific Gravity, Urine 1.019 1.005 - 1.030   pH 5.0 5.0 - 8.0   Glucose, UA NEGATIVE NEGATIVE mg/dL   Hgb urine dipstick LARGE (A) NEGATIVE   Bilirubin Urine NEGATIVE NEGATIVE   Ketones, ur NEGATIVE NEGATIVE mg/dL   Protein, ur NEGATIVE NEGATIVE mg/dL   Nitrite NEGATIVE NEGATIVE   Leukocytes, UA NEGATIVE NEGATIVE   RBC / HPF 6-10 0 - 5 RBC/hpf   WBC, UA 0-5 0 - 5 WBC/hpf   Bacteria, UA RARE (A) NONE SEEN   Squamous Epithelial / LPF 0-5 0 - 5   Mucus PRESENT   Pregnancy, urine POC     Status: Abnormal   Collection Time: 03/12/18 10:21 AM  Result Value Ref Range   Preg Test, Ur POSITIVE (A) NEGATIVE  Wet prep, genital     Status: Abnormal   Collection Time: 03/12/18 11:33 AM  Result Value Ref Range   Yeast Wet Prep HPF POC NONE SEEN NONE SEEN   Trich, Wet Prep NONE SEEN NONE SEEN   Clue Cells Wet Prep HPF POC NONE SEEN NONE SEEN   WBC, Wet Prep HPF POC FEW (A) NONE SEEN   Sperm NONE SEEN   hCG, quantitative, pregnancy     Status: Abnormal   Collection Time:  03/12/18 11:58 AM  Result Value Ref Range   hCG, Beta Chain, Quant, S 129 (H) <5 mIU/mL  CBC with Differential/Platelet     Status: None   Collection Time: 03/12/18 11:58 AM  Result Value Ref Range   WBC 5.8 4.0 - 10.5 K/uL   RBC 4.11 3.87 - 5.11 MIL/uL   Hemoglobin 12.7 12.0 - 15.0 g/dL   HCT 96.0 45.4 - 09.8 %   MCV 90.8 78.0 - 100.0 fL   MCH 30.9 26.0 - 34.0 pg   MCHC 34.0 30.0 - 36.0 g/dL   RDW 11.9 14.7 - 82.9 %   Platelets 289  150 - 400 K/uL   Neutrophils Relative % 47 %   Neutro Abs 2.7 1.7 - 7.7 K/uL   Lymphocytes Relative 42 %   Lymphs Abs 2.4 0.7 - 4.0 K/uL   Monocytes Relative 9 %   Monocytes Absolute 0.5 0.1 - 1.0 K/uL   Eosinophils Relative 2 %   Eosinophils Absolute 0.1 0.0 - 0.7 K/uL   Basophils Relative 0 %   Basophils Absolute 0.0 0.0 - 0.1 K/uL  ABO/Rh     Status: None   Collection Time: 03/12/18 11:59 AM  Result Value Ref Range   ABO/RH(D)      A POS Performed at [facility], [address], [place], [place] [address]   Type and screen [facility] [place]     Status: None (Preliminary result)   Collection Time: 03/12/18 11:59 AM  Result Value Ref Range   ABO/RH(D) A POS    Antibody Screen NEG    Sample Expiration      03/15/2018 Performed at [facility], [address] [place], [place] [address]   AST     Status: None   Collection Time: 03/12/18 12:49 PM  Result Value Ref Range   AST 18 15 - 41 U/L  BUN     Status: None   Collection Time: 03/12/18 12:49 PM  Result Value Ref Range   BUN 13 6 - 20 mg/dL  Creatinine, serum     Status: None   Collection Time: 03/12/18 12:49 PM  Result Value Ref Range   Creatinine, Ser 0.76 0.44 - 1.00 mg/dL   GFR calc non Af Amer >60 >60 mL/min   GFR calc Af Amer >60 >60 mL/min    U/S OB Less Than 14 Weeks With OB Transvaginal  Result Date: 03/12/2018 CLINICAL DATA:  Vaginal bleeding affecting early pregnancy; quantitative beta HCG = 129 EXAM: OBSTETRIC <14 WK [place] AND TRANSVAGINAL OB US  TECHNIQUE: Both transabdominal and transvaginal ultrasound examinations were performed for complete evaluation of the gestation as well as the maternal uterus, adnexal regions, and pelvic cul-de-sac. Transvaginal technique was performed to assess early pregnancy. COMPARISON:  None for this gestation FINDINGS: Intrauterine gestational sac: Absent Yolk sac:  N/A Embryo:  N/A Cardiac Activity: N/A Heart Rate: N/A  bpm MSD:   mm    w     d CRL:    mm    w    d                  [place] EDC: Subchorionic hemorrhage:  N/A Maternal uterus/adnexae: Retroverted uterus. Small amount of mobile complex fluid/blood within endometrial canal. No focal uterine mass or gestational sac. RIGHT ovary normal size and morphology, 2.5 x 1.4 x 1.7 cm. LEFT ovary normal size and morphology, 3.0 x 2.0 x 1.6 cm. Small amount of nonspecific free pelvic fluid. In RIGHT adnexa a small soft tissue nodule is identified which is separate from the RIGHT ovary measuring 2.5 x 1.4 x 1.7 cm and demonstrating peripheral hypervascularity. Finding is highly suspicious for a RIGHT adnexal ectopic pregnancy. No other adnexal masses. IMPRESSION: Small amount of blood within endometrial canal but no intrauterine gestational sac identified. RIGHT adnexal mass 2.5 x 1.4 x 1.7 cm demonstrating peripheral hypervascularity on color Doppler imaging, highly suspicious for a RIGHT adnexal ectopic pregnancy. Critical Value/emergent results were called by telephone at the time of interpretation on 03/12/2018 at 1301 hrs to Ginnie Smart RN in MAU, who verbally acknowledged these results. Electronically Signed  By: Ulyses Southward M.D.   On: 03/12/2018 13:03    Assessment and Plan  Right tubal pregnancy without intrauterine pregnancy - MTX precautions reviewed - Advised to return to MAU for increased pain and/or bleeding - Return to MAU on Wednesday 5/15 (day 4) and Saturday 5/18 (day 7) for rpt HCG - Information provided on ectopic pregnancy & MTX - Note to be OOW until  Tuesday 5/14 given  - Discharge patient - Patient verbalized an understanding of the plan of care and agrees.    Raelyn Mora, MSN, CNM 03/12/2018, 11:33 AM

## 2018-03-12 NOTE — Progress Notes (Addendum)
G5P4 @ early pregnant. Here dt bleeding and abd pain that started yesterdayl Last intercourse was Friday.   1137: Provider at bs assessing. GC and wet prep and pelvic done.   1148: Notified lab for stat labs.  1155: lab at bs   1156: U/S paged  1317: lab notified for more labs.    1326: provider made aware extra labs ordered does not need to be drawn dt enough specimen drawn earlier and is in process   1445: MTX given   1522: d/c instructions given with pt understanding. Made aware to come back in 4 days for hcG levels.

## 2018-03-13 LAB — GC/CHLAMYDIA PROBE AMP (~~LOC~~) NOT AT ARMC
Chlamydia: NEGATIVE
NEISSERIA GONORRHEA: NEGATIVE

## 2018-03-13 LAB — HIV ANTIBODY (ROUTINE TESTING W REFLEX): HIV Screen 4th Generation wRfx: NONREACTIVE

## 2018-03-15 ENCOUNTER — Other Ambulatory Visit: Payer: Self-pay

## 2018-03-15 ENCOUNTER — Encounter (HOSPITAL_COMMUNITY): Payer: Self-pay | Admitting: *Deleted

## 2018-03-15 ENCOUNTER — Inpatient Hospital Stay (HOSPITAL_COMMUNITY)
Admission: AD | Admit: 2018-03-15 | Discharge: 2018-03-15 | Disposition: A | Discharge status: Home / Self Care | Payer: 59 | Source: Ambulatory Visit | Attending: Obstetrics and Gynecology | Admitting: Obstetrics and Gynecology

## 2018-03-15 DIAGNOSIS — O00101 Right tubal pregnancy without intrauterine pregnancy: Secondary | ICD-10-CM

## 2018-03-15 DIAGNOSIS — Z9889 Other specified postprocedural states: Secondary | ICD-10-CM | POA: Insufficient documentation

## 2018-03-15 DIAGNOSIS — K589 Irritable bowel syndrome without diarrhea: Secondary | ICD-10-CM | POA: Diagnosis not present

## 2018-03-15 DIAGNOSIS — R109 Unspecified abdominal pain: Secondary | ICD-10-CM | POA: Diagnosis not present

## 2018-03-15 DIAGNOSIS — N939 Abnormal uterine and vaginal bleeding, unspecified: Secondary | ICD-10-CM | POA: Insufficient documentation

## 2018-03-15 DIAGNOSIS — Z9049 Acquired absence of other specified parts of digestive tract: Secondary | ICD-10-CM | POA: Insufficient documentation

## 2018-03-15 DIAGNOSIS — K219 Gastro-esophageal reflux disease without esophagitis: Secondary | ICD-10-CM | POA: Insufficient documentation

## 2018-03-15 DIAGNOSIS — O00109 Unspecified tubal pregnancy without intrauterine pregnancy: Secondary | ICD-10-CM | POA: Insufficient documentation

## 2018-03-15 DIAGNOSIS — Z09 Encounter for follow-up examination after completed treatment for conditions other than malignant neoplasm: Secondary | ICD-10-CM | POA: Insufficient documentation

## 2018-03-15 LAB — HCG, QUANTITATIVE, PREGNANCY: HCG, BETA CHAIN, QUANT, S: 49 m[IU]/mL — AB (ref <5)

## 2018-03-15 MED ORDER — OXYCODONE-ACETAMINOPHEN 5-325 MG PO TABS
1.0000 | ORAL_TABLET | Freq: Once | ORAL | Status: AC
  Administered 2018-03-15: 1 via ORAL
  Filled 2018-03-15: qty 1

## 2018-03-15 MED ORDER — OXYCODONE-ACETAMINOPHEN 5-325 MG PO TABS: 1.0000 | ORAL_TABLET | Freq: Every day | ORAL | 0 refills | Status: AC | PRN

## 2018-03-15 NOTE — MAU Note (Signed)
Day 4 post methotrexate.  Increased pain and bleeding.  Has a lot of questions, feels "what to expect", was not explained. Pain in lower abd, moderate/worse than period cramps.  Changing pad every 1-2 hrs, small clots.

## 2018-03-15 NOTE — Discharge Instructions (Signed)
Methotrexate Treatment for an Ectopic Pregnancy, Care After °Refer to this sheet in the next few weeks. These instructions provide you with information on caring for yourself after your procedure. Your health care provider may also give you more specific instructions. Your treatment has been planned according to current medical practices, but problems sometimes occur. Call your health care provider if you have any problems or questions after your procedure. °What can I expect after the procedure? °You may have some abdominal cramping, vaginal bleeding, and fatigue in the first few days after taking methotrexate. Some other possible side effects of methotrexate include: °· Nausea. °· Vomiting. °· Diarrhea. °· Mouth sores. °· Swelling or irritation of the lining of your lungs (pneumonitis). °· Liver damage. °· Hair loss. ° °Follow these instructions at home: °After you have received the methotrexate medicine, you need to be careful of your activities and watch your condition for several weeks. It may take 1 week before your hormone levels return to normal. °Activity °· Do not have sexual intercourse until your health care provider says it is safe to do so. °· You may resume your usual diet. °· Limit strenuous activity. °· Do not drink alcohol. °General instructions °· Do not take aspirin, ibuprofen, or naproxen (nonsteroidal anti-inflammatory drugs [NSAIDs]). °· Do not take folic acid, prenatal vitamins, or other vitamins that contain folic acid. °· Avoid traveling too far away from your health care provider. °· Keep all follow-up visits as told by your health care provider. This is important. °Contact a health care provider if: °· You cannot control your nausea and vomiting. °· You cannot control your diarrhea. °· You have sores in your mouth and want treatment. °· You need pain medicine for your abdominal pain. °· You have a rash. °· You are having a reaction to the medicine. °Get help right away if: °· You have  increasing abdominal or pelvic pain. °· You notice increased bleeding. °· You feel light-headed, or you faint. °· You have shortness of breath. °· Your heart rate increases. °· You have a cough. °· You have chills. °· You have a fever. °This information is not intended to replace advice given to you by your health care provider. Make sure you discuss any questions you have with your health care provider. °Document Released: 10/07/2011 Document Revised: 03/25/2016 Document Reviewed: 08/06/2013 °Elsevier Interactive Patient Education © 2017 Elsevier Inc. ° °

## 2018-03-15 NOTE — MAU Provider Note (Signed)
Chief Complaint: Follow-up   First Provider Initiated Contact with Patient 03/15/18 1201      SUBJECTIVE HPI: Jasmine Mckenzie is a 37 y.o. [id number] at [redacted]w[redacted]d by LMP who presents to maternity admissions for day 4 methotrexate follow up with continued abdominal pain and vaginal bleeding. She reports vaginal bleeding is heavier than it was on day 1 of methotrexate, describes the bleeding as heavier than a period that is dark red vaginal bleeding. She reports continued abdominal pain is associated with the vaginal bleeding. She describes abdominal pain as cramping that is "worse than menstrual cramps", rates abdominal pain 4/10- has taken 1,000mg  Tylenol for abdominal pain with some relief. Reports pain is in her lower abdomen not specific to one side. She denies h/a, dizziness, n/v, lightheadedness, or fever/chills.     Past Medical History:  Diagnosis Date  . Arthritis   . GERD (gastroesophageal reflux disease)   . Heart palpitations   . IBS (irritable bowel syndrome)    Past Surgical History:  Procedure Laterality Date  . BREAST SURGERY     augmentation  . CHOLECYSTECTOMY N/A 08/17/2016   Procedure: LAPAROSCOPIC CHOLECYSTECTOMY WITH INTRAOPERATIVE CHOLANGIOGRAM;  Surgeon: Manus Rudd, MD;  Location: [facility] OR;  Service: General;  Laterality: N/A;  . UMBILICAL HERNIA REPAIR N/A 08/17/2016   Procedure: HERNIA REPAIR UMBILICAL ADULT;  Surgeon: Manus Rudd, MD;  Location: [facility] OR;  Service: General;  Laterality: N/A;   Social History   Socioeconomic History  . Marital status: Married    Spouse name: Not on file  . Number of children: Not on file  . Years of education: Not on file  . Highest education level: Not on file  Occupational History  . Not on file  Social Needs  . Financial resource strain: Not on file  . Food insecurity:    Worry: Not on file    Inability: Not on file  . Transportation needs:    Medical: Not on file    Non-medical: Not on file  Tobacco Use  . Smoking  status: Never Smoker  . Smokeless tobacco: Never Used  Substance and Sexual Activity  . Alcohol use: Yes    Comment: occasional wine  . Drug use: No  . Sexual activity: Yes    Birth control/protection: None  Lifestyle  . Physical activity:    Days per week: Not on file    Minutes per session: Not on file  . Stress: Not on file  Relationships  . Social connections:    Talks on phone: Not on file    Gets together: Not on file    Attends religious service: Not on file    Active member of club or organization: Not on file    Attends meetings of clubs or organizations: Not on file    Relationship status: Not on file  . Intimate partner violence:    Fear of current or ex partner: Not on file    Emotionally abused: Not on file    Physically abused: Not on file    Forced sexual activity: Not on file  Other Topics Concern  . Not on file  Social History Narrative  . Not on file   No current facility-administered medications on file prior to encounter.    Current Outpatient Medications on File Prior to Encounter  Medication Sig Dispense Refill  . acetaminophen (TYLENOL) 500 MG tablet Take 1,000 mg by mouth every 6 (six) hours as needed for moderate pain.     Allergies  Allergen  Reactions  . No Known Allergies     ROS:  Review of Systems  Constitutional: Negative.   Respiratory: Negative.   Cardiovascular: Negative.   Gastrointestinal: Positive for abdominal pain.  Genitourinary: Positive for vaginal bleeding.  Neurological: Negative.    I have reviewed patient's Past Medical Hx, Surgical Hx, Family Hx, Social Hx, medications and allergies.   Physical Exam   Patient Vitals for the past 24 hrs:  BP Temp Temp src Pulse Resp SpO2  03/15/18 1352 (!) 127/93 - - 73 - -  03/15/18 1142 118/83 99 F (37.2 C) Oral 77 18 100 %   Constitutional: Well-developed, well-nourished female in no acute distress.  Cardiovascular: normal rate Respiratory: normal effort GI: Abd soft, non  tender. Pos BS x 4 MS: Extremities nontender, no edema, normal ROM Neurologic: Alert and oriented x 4.  GU: Neg CVAT.  PELVIC EXAM: deferred   LAB RESULTS Results for orders placed or performed during the hospital encounter of 03/15/18 (from the past 24 hour(s))  hCG, quantitative, pregnancy     Status: Abnormal   Collection Time: 03/15/18 12:17 PM  Result Value Ref Range   hCG, Beta Chain, Quant, S 49 (H) <5 mIU/mL    --/--/A POS Performed at [facility], [address] [place], [place] [address] , A POS (05/12 1159)  MAU Management/MDM: Orders Placed This Encounter  Procedures  . hCG, quantitative, pregnancy   HCG- showed significant drop from previous quant. 129 on 5/12 to 49 on 5/15  Meds ordered this encounter  Medications  . oxyCODONE-acetaminophen (PERCOCET/ROXICET) 5-325 MG per tablet 1 tablet  . oxyCODONE-acetaminophen (PERCOCET/ROXICET) 5-325 MG tablet    Sig: Take 1 tablet by mouth daily as needed for up to 5 days for severe pain.    Dispense:  5 tablet    Refill:  0    Order Specific Question:   Supervising Provider    Answer:   Alysia Penna, MICHAEL L [[id number]]    Treatments in MAU included 1 tablet of percocet for pain, pt reports no pain after medication treatment. Pt discharged with strict ectopic precautions and to return to MAU on Saturday for day 7 methotrexate lab work.  ASSESSMENT 1. Right tubal pregnancy without intrauterine pregnancy     PLAN Discharge home Follow up as scheduled on Saturday for Day 7 labs  Return to MAU as needed for emergencies  Rx for percocet sent to pharmacy of choice  Work note given to patient to be out of work until Monday   Allergies as of 03/15/2018      Reactions   No Known Allergies       Medication List    TAKE these medications   acetaminophen 500 MG tablet Commonly known as:  TYLENOL Take 1,000 mg by mouth every 6 (six) hours as needed for moderate pain.   oxyCODONE-acetaminophen 5-325 MG tablet Commonly  known as:  PERCOCET/ROXICET Take 1 tablet by mouth daily as needed for up to 5 days for severe pain.      Steward Drone  Certified Nurse-Midwife 03/15/2018  3:06 PM

## 2018-03-18 ENCOUNTER — Inpatient Hospital Stay (HOSPITAL_COMMUNITY)
Admission: AD | Admit: 2018-03-18 | Discharge: 2018-03-18 | Disposition: A | Discharge status: Home / Self Care | Payer: 59 | Source: Ambulatory Visit | Attending: Obstetrics and Gynecology | Admitting: Obstetrics and Gynecology

## 2018-03-18 DIAGNOSIS — Z09 Encounter for follow-up examination after completed treatment for conditions other than malignant neoplasm: Secondary | ICD-10-CM | POA: Diagnosis present

## 2018-03-18 DIAGNOSIS — O00101 Right tubal pregnancy without intrauterine pregnancy: Secondary | ICD-10-CM | POA: Diagnosis not present

## 2018-03-18 LAB — CREATININE, SERUM
Creatinine, Ser: 0.69 mg/dL (ref 0.44–1.00)
GFR calc Af Amer: >60 mL/min (ref >60)
GFR calc non Af Amer: >60 mL/min (ref >60)

## 2018-03-18 LAB — CBC WITH DIFFERENTIAL/PLATELET
BASOS ABS: 0 10*3/uL (ref 0.0–0.1)
BASOS PCT: 0 %
EOS ABS: 0.2 10*3/uL (ref 0.0–0.7)
EOS PCT: 3 %
HCT: 37.1 % (ref 36.0–46.0)
HEMOGLOBIN: 12.5 g/dL (ref 12.0–15.0)
LYMPHS ABS: 2.1 10*3/uL (ref 0.7–4.0)
Lymphocytes Relative: 43 %
MCH: 30.9 pg (ref 26.0–34.0)
MCHC: 33.7 g/dL (ref 30.0–36.0)
MCV: 91.8 fL (ref 78.0–100.0)
Monocytes Absolute: 0.2 10*3/uL (ref 0.1–1.0)
Monocytes Relative: 4 %
Neutro Abs: 2.4 10*3/uL (ref 1.7–7.7)
Neutrophils Relative %: 50 %
PLATELETS: 252 10*3/uL (ref 150–400)
RBC: 4.04 MIL/uL (ref 3.87–5.11)
RDW: 12.3 % (ref 11.5–15.5)
WBC: 4.8 10*3/uL (ref 4.0–10.5)

## 2018-03-18 LAB — HCG, QUANTITATIVE, PREGNANCY: HCG, BETA CHAIN, QUANT, S: 17 m[IU]/mL — AB (ref <5)

## 2018-03-18 LAB — AST: AST: 32 U/L (ref 15–41)

## 2018-03-18 LAB — BUN: BUN: 12 mg/dL (ref 6–20)

## 2018-03-18 NOTE — Discharge Instructions (Signed)
Ectopic Pregnancy °An ectopic pregnancy happens when a fertilized egg grows outside the uterus. A pregnancy cannot live outside of the uterus. This problem often happens in the fallopian tube. It is often caused by damage to the fallopian tube. °If this problem is found early, you may be treated with medicine. If your tube tears or bursts open (ruptures), you will bleed inside. This is an emergency. You will need surgery. Get help right away. °What are the signs or symptoms? °You may have normal pregnancy symptoms at first. These include: °· Missing your period. °· Feeling sick to your stomach (nauseous). °· Being tired. °· Having tender breasts. ° °Then, you may start to have symptoms that are not normal. These include: °· Pain with sex (intercourse). °· Bleeding from the vagina. This includes light bleeding (spotting). °· Belly (abdomen) or lower belly cramping or pain. This may be felt on one side. °· A fast heartbeat (pulse). °· Passing out (fainting) after going poop (bowel movement). ° °If your tube tears, you may have symptoms such as: °· Really bad pain in the belly or lower belly. This happens suddenly. °· Dizziness. °· Passing out. °· Shoulder pain. ° °Get help right away if: °You have any of these symptoms. This is an emergency. °This information is not intended to replace advice given to you by your health care provider. Make sure you discuss any questions you have with your health care provider. °Document Released: 01/14/2009 Document Revised: 03/25/2016 Document Reviewed: 05/30/2013 °Elsevier Interactive Patient Education © 2017 Elsevier Inc. ° °

## 2018-03-18 NOTE — MAU Provider Note (Signed)
[name]  is a 37 y.o. [id number]  at [redacted]w[redacted]d who presents to MAU today for follow-up quant hCG. She is day 7 s/p methotrexate for ectopic pregnancy.   The patient denies abdominal pain, vaginal bleeding, N/V or fever.   BP 130/80 (BP Location: Right Arm)   Pulse 74   Temp 98.6 F (37 C) (Oral)   Resp 18   Ht  (1.575 m)   Wt 147 lb 6.4 oz (66.9 kg)   LMP 02/02/2018   SpO2 100%   BMI 26.96 kg/m   GENERAL: Well-developed, well-nourished female in no acute distress.  HEENT: Normocephalic, atraumatic.   LUNGS: Effort normal HEART: Regular rate  SKIN: Warm, dry and without erythema PSYCH: Normal mood and affect   A: Appropriate drop in HCG. Will start weekly HCG checks Component     Latest Ref Rng & Units 03/12/2018 03/15/2018 03/18/2018  HCG, Beta Chain, Quant, S     <5 mIU/mL 129 (H) MTX given 49 (H) Day 4 17 (H) Day 7    P: Discharge home F/u hcg in 1 week -- msg sent to [facility] Discussed reasons to return to MAU Continue pelvic rest  Judeth Horn, NP  03/18/2018 1:42 PM

## 2018-03-18 NOTE — MAU Note (Signed)
Patient presents to mau for day 7 labs. Denies any pain at this time or new vaginal bleeding. Reports still having brown spotting.

## 2018-03-20 ENCOUNTER — Telehealth: Payer: Self-pay | Admitting: General Practice

## 2018-03-20 NOTE — Telephone Encounter (Signed)
-----   Message from Judeth Horn, NP sent at 03/18/2018  1:44 PM EDT ----- Needs to start weekly non stat HCGs for ectopic pregnancy. Next HCG should be Friday 5/24  Thanks!

## 2018-03-20 NOTE — Telephone Encounter (Signed)
Left message for patient to give our office a call in regards to appointment scheduled for Friday, 03/24/18 at 10:00am.

## 2018-03-23 ENCOUNTER — Other Ambulatory Visit: Payer: Self-pay | Admitting: *Deleted

## 2018-03-23 ENCOUNTER — Other Ambulatory Visit (INDEPENDENT_AMBULATORY_CARE_PROVIDER_SITE_OTHER): Payer: Self-pay

## 2018-03-23 DIAGNOSIS — O00101 Right tubal pregnancy without intrauterine pregnancy: Secondary | ICD-10-CM

## 2018-03-24 ENCOUNTER — Other Ambulatory Visit: Payer: 59

## 2018-03-24 ENCOUNTER — Telehealth: Payer: Self-pay | Admitting: Student

## 2018-03-24 ENCOUNTER — Encounter: Payer: Self-pay | Admitting: Student

## 2018-03-24 LAB — BETA HCG QUANT (REF LAB): hCG Quant: 2 m[IU]/mL

## 2018-03-24 NOTE — Telephone Encounter (Signed)
Pt returned phone call. Verified by name & DOB. Notified of HCG results.   Judeth Horn, NP

## 2018-03-24 NOTE — Telephone Encounter (Signed)
Attempted to contact patient regarding HCG results. Left voicemail to return call. HCG down to 2, no need for follow up.   Judeth Horn, NP

## 2018-03-31 ENCOUNTER — Other Ambulatory Visit: Payer: 59

## 2018-04-07 ENCOUNTER — Other Ambulatory Visit: Payer: 59

## 2018-10-04 ENCOUNTER — Ambulatory Visit
Admission: RE | Admit: 2018-10-04 | Discharge: 2018-10-04 | Disposition: A | Discharge status: Home / Self Care | Payer: 59 | Source: Ambulatory Visit | Attending: Internal Medicine | Admitting: Internal Medicine

## 2018-10-04 ENCOUNTER — Other Ambulatory Visit: Payer: Self-pay | Admitting: Internal Medicine

## 2018-10-04 DIAGNOSIS — M549 Dorsalgia, unspecified: Secondary | ICD-10-CM

## 2018-10-04 DIAGNOSIS — R52 Pain, unspecified: Secondary | ICD-10-CM

## 2018-10-04 DIAGNOSIS — M79604 Pain in right leg: Secondary | ICD-10-CM

## 2018-12-02 ENCOUNTER — Ambulatory Visit (HOSPITAL_COMMUNITY)
Admission: EM | Admit: 2018-12-02 | Discharge: 2018-12-02 | Disposition: A | Discharge status: Home / Self Care | Payer: 59 | Attending: Family Medicine | Admitting: Family Medicine

## 2018-12-02 ENCOUNTER — Other Ambulatory Visit: Payer: Self-pay

## 2018-12-02 ENCOUNTER — Encounter (HOSPITAL_COMMUNITY): Payer: Self-pay

## 2018-12-02 DIAGNOSIS — R69 Illness, unspecified: Secondary | ICD-10-CM

## 2018-12-02 DIAGNOSIS — J111 Influenza due to unidentified influenza virus with other respiratory manifestations: Secondary | ICD-10-CM

## 2018-12-02 MED ORDER — OSELTAMIVIR PHOSPHATE 75 MG PO CAPS: 75.0000 mg | ORAL_CAPSULE | Freq: Two times a day (BID) | ORAL | 0 refills | Status: AC

## 2018-12-02 NOTE — ED Provider Notes (Signed)
[facility]   [id number] 12/02/18 Arrival Time: 1323  ASSESSMENT & PLAN:  1. Influenza-like illness    See AVS for discharge instructions.  Meds ordered this encounter  Medications  . oseltamivir (TAMIFLU) 75 MG capsule    Sig: Take 1 capsule (75 mg total) by mouth 2 (two) times daily for 5 days.    Dispense:  10 capsule    Refill:  0   Discussed typical duration of symptoms. OTC symptom care as needed. Ensure adequate fluid intake and rest. May f/u with PCP or here as needed.  Reviewed expectations re: course of current medical issues. Questions answered. Outlined signs and symptoms indicating need for more acute intervention. Patient verbalized understanding. After Visit Summary given.   SUBJECTIVE: History from: patient.  Shalonna Wiedel is a 38 y.o. female who presents with complaint of nasal congestion, post-nasal drainage, and a persistent dry cough; without sore throat. Onset abrupt, < 48 hours ago; with fatigue and with body aches. SOB: none. Wheezing: none. Fever: yes, subjective. Overall normal PO intake without n/v. Known sick contacts: no. No specific or significant aggravating or alleviating factors reported. OTC treatment: OTC without much relief.  Received flu shot this year: no.  Social History   Tobacco Use  Smoking Status Never Smoker  Smokeless Tobacco Never Used    ROS: As per HPI.   OBJECTIVE:  Vitals:   12/02/18 1413 12/02/18 1415  BP:  (!) 138/95  Pulse:  90  Resp:  18  Temp:  99.7 F (37.6 C)  TempSrc:  Oral  SpO2:  100%  Weight: 66.7 kg      General appearance: alert; appears fatigued HEENT: nasal congestion; clear runny nose; throat irritation secondary to post-nasal drainage Neck: supple without LAD CV: RRR Lungs: unlabored respirations, symmetrical air entry without wheezing; cough: moderate Abd: soft Ext: no LE edema Skin: warm and dry Psychological: alert and cooperative; normal mood and  affect   Allergies  Allergen Reactions  . No Known Allergies     Past Medical History:  Diagnosis Date  . Arthritis   . GERD (gastroesophageal reflux disease)   . Heart palpitations   . IBS (irritable bowel syndrome)    Family History  Problem Relation Age of Onset  . Hypertension Mother   . Diabetes Father   . Hypertension Father    Social History   Socioeconomic History  . Marital status: Married    Spouse name: Not on file  . Number of children: Not on file  . Years of education: Not on file  . Highest education level: Not on file  Occupational History  . Not on file  Social Needs  . Financial resource strain: Not on file  . Food insecurity:    Worry: Not on file    Inability: Not on file  . Transportation needs:    Medical: Not on file    Non-medical: Not on file  Tobacco Use  . Smoking status: Never Smoker  . Smokeless tobacco: Never Used  Substance and Sexual Activity  . Alcohol use: Yes    Comment: occasional wine  . Drug use: No  . Sexual activity: Yes    Birth control/protection: None  Lifestyle  . Physical activity:    Days per week: Not on file    Minutes per session: Not on file  . Stress: Not on file  Relationships  . Social connections:    Talks on phone: Not on file    Gets together: Not  on file    Attends religious service: Not on file    Active member of club or organization: Not on file    Attends meetings of clubs or organizations: Not on file    Relationship status: Not on file  . Intimate partner violence:    Fear of current or ex partner: Not on file    Emotionally abused: Not on file    Physically abused: Not on file    Forced sexual activity: Not on file  Other Topics Concern  . Not on file  Social History Narrative  . Not on file           Mardella LaymanHagler, Carrera Kiesel, MD 12/04/18 1034

## 2018-12-02 NOTE — Discharge Instructions (Signed)

## 2018-12-02 NOTE — ED Triage Notes (Signed)
Pt cc her gums are swollen. Pt  Cc coughing , headaches, and nose burning. Pt had a colonoscopy on Wednesday that's when all of this started.

## 2019-01-15 ENCOUNTER — Emergency Department (HOSPITAL_COMMUNITY)
Admission: EM | Admit: 2019-01-15 | Discharge: 2019-01-15 | Disposition: A | Discharge status: Home / Self Care | Payer: 59 | Attending: Emergency Medicine | Admitting: Emergency Medicine

## 2019-01-15 ENCOUNTER — Other Ambulatory Visit: Payer: Self-pay

## 2019-01-15 ENCOUNTER — Encounter (HOSPITAL_COMMUNITY): Payer: Self-pay | Admitting: *Deleted

## 2019-01-15 ENCOUNTER — Emergency Department (HOSPITAL_COMMUNITY): Payer: 59

## 2019-01-15 DIAGNOSIS — R6889 Other general symptoms and signs: Secondary | ICD-10-CM

## 2019-01-15 DIAGNOSIS — J069 Acute upper respiratory infection, unspecified: Secondary | ICD-10-CM

## 2019-01-15 DIAGNOSIS — Z209 Contact with and (suspected) exposure to unspecified communicable disease: Secondary | ICD-10-CM | POA: Diagnosis not present

## 2019-01-15 DIAGNOSIS — B9789 Other viral agents as the cause of diseases classified elsewhere: Secondary | ICD-10-CM

## 2019-01-15 DIAGNOSIS — B9729 Other coronavirus as the cause of diseases classified elsewhere: Secondary | ICD-10-CM | POA: Insufficient documentation

## 2019-01-15 DIAGNOSIS — Z20822 Contact with and (suspected) exposure to covid-19: Secondary | ICD-10-CM

## 2019-01-15 DIAGNOSIS — R05 Cough: Secondary | ICD-10-CM | POA: Diagnosis present

## 2019-01-15 LAB — CBC WITH DIFFERENTIAL/PLATELET
Abs Immature Granulocytes: 0.02 10*3/uL (ref 0.00–0.07)
Basophils Absolute: 0 10*3/uL (ref 0.0–0.1)
Basophils Relative: 0 %
EOS ABS: 0 10*3/uL (ref 0.0–0.5)
Eosinophils Relative: 1 %
HEMATOCRIT: 34.9 % — AB (ref 36.0–46.0)
Hemoglobin: 11.5 g/dL — ABNORMAL LOW (ref 12.0–15.0)
Immature Granulocytes: 0 %
Lymphocytes Relative: 20 %
Lymphs Abs: 1.3 10*3/uL (ref 0.7–4.0)
MCH: 29.6 pg (ref 26.0–34.0)
MCHC: 33 g/dL (ref 30.0–36.0)
MCV: 89.9 fL (ref 80.0–100.0)
Monocytes Absolute: 0.5 10*3/uL (ref 0.1–1.0)
Monocytes Relative: 8 %
Neutro Abs: 4.7 10*3/uL (ref 1.7–7.7)
Neutrophils Relative %: 71 %
Platelets: 226 10*3/uL (ref 150–400)
RBC: 3.88 MIL/uL (ref 3.87–5.11)
RDW: 12.3 % (ref 11.5–15.5)
WBC: 6.6 10*3/uL (ref 4.0–10.5)
nRBC: 0 % (ref 0.0–0.2)

## 2019-01-15 LAB — COMPREHENSIVE METABOLIC PANEL
ALT: 13 U/L (ref 0–44)
AST: 18 U/L (ref 15–41)
Albumin: 3.7 g/dL (ref 3.5–5.0)
Alkaline Phosphatase: 42 U/L (ref 38–126)
Anion gap: 6 (ref 5–15)
BILIRUBIN TOTAL: 0.3 mg/dL (ref 0.3–1.2)
BUN: 5 mg/dL — ABNORMAL LOW (ref 6–20)
CO2: 23 mmol/L (ref 22–32)
Calcium: 8.5 mg/dL — ABNORMAL LOW (ref 8.9–10.3)
Chloride: 107 mmol/L (ref 98–111)
Creatinine, Ser: 0.79 mg/dL (ref 0.44–1.00)
GFR calc Af Amer: >60 mL/min (ref >60)
GFR calc non Af Amer: >60 mL/min (ref >60)
Glucose, Bld: 96 mg/dL (ref 70–99)
Potassium: 3.1 mmol/L — ABNORMAL LOW (ref 3.5–5.1)
Sodium: 136 mmol/L (ref 135–145)
Total Protein: 6.4 g/dL — ABNORMAL LOW (ref 6.5–8.1)

## 2019-01-15 LAB — INFLUENZA PANEL BY PCR (TYPE A & B)
INFLBPCR: NEGATIVE
Influenza A By PCR: NEGATIVE

## 2019-01-15 LAB — GROUP A STREP BY PCR: Group A Strep by PCR: NOT DETECTED

## 2019-01-15 LAB — MONONUCLEOSIS SCREEN: Mono Screen: NEGATIVE

## 2019-01-15 MED ORDER — POTASSIUM CHLORIDE CRYS ER 20 MEQ PO TBCR: 40.0000 meq | EXTENDED_RELEASE_TABLET | Freq: Once | ORAL | Status: DC

## 2019-01-15 MED ORDER — IBUPROFEN 800 MG PO TABS
800.0000 mg | ORAL_TABLET | Freq: Once | ORAL | Status: DC
  Filled 2019-01-15: qty 1

## 2019-01-15 MED ORDER — ACETAMINOPHEN 500 MG PO TABS
1000.0000 mg | ORAL_TABLET | Freq: Once | ORAL | Status: AC
  Administered 2019-01-15: 1000 mg via ORAL
  Filled 2019-01-15: qty 2

## 2019-01-15 NOTE — Discharge Instructions (Addendum)
Keep yourself hydrated.  Use Tylenol or ibuprofen as needed for aches and fevers.  Follow the quarantine guidelines were given and do not leave your house except for medical care.  Return to the ED with new or worsening symptoms.

## 2019-01-15 NOTE — ED Provider Notes (Signed)
[facility] EMERGENCY DEPARTMENT Provider Note   CSN: [id number] Arrival date & time: 01/15/19  0335    History   Chief Complaint Chief Complaint  Patient presents with  . Cough    HPI Jasmine Mckenzie is a 38 y.o. female.     Patient reports waking up this morning with fever to 102, cough, body aches.  Her cough is nonproductive.  States she is had a sore throat for the past 3 weeks has been fairly constant and is no worse tonight.  States she was treated for the flu earlier in February with Tamiflu and got over that.  States she was visiting [place] on February 29 and again 5 days ago and was around many people who had sore throats.  She states her sore throat started on February 29.  She did not have any fever, body aches or coughing until today.  She did receive a flu shot.  She denies any difficulty breathing or difficulty swallowing.  No chest pain or shortness of breath.  No abdominal pain, nausea or vomiting.  The history is provided by the patient.  Cough  Associated symptoms: fever, headaches, myalgias, rhinorrhea and sore throat   Associated symptoms: no chest pain and no rash     Past Medical History:  Diagnosis Date  . Arthritis   . GERD (gastroesophageal reflux disease)   . Heart palpitations   . IBS (irritable bowel syndrome)     Patient Active Problem List   Diagnosis Date Noted  . Chronic cholecystitis with calculus 08/17/2016    Past Surgical History:  Procedure Laterality Date  . BREAST SURGERY     augmentation  . CHOLECYSTECTOMY N/A 08/17/2016   Procedure: LAPAROSCOPIC CHOLECYSTECTOMY WITH INTRAOPERATIVE CHOLANGIOGRAM;  Surgeon: Manus Rudd, MD;  Location: [facility] OR;  Service: General;  Laterality: N/A;  . UMBILICAL HERNIA REPAIR N/A 08/17/2016   Procedure: HERNIA REPAIR UMBILICAL ADULT;  Surgeon: Manus Rudd, MD;  Location: [facility] OR;  Service: General;  Laterality: N/A;     OB History    Gravida  5   Para  4   Term  4   Preterm      AB  1   Living  4     SAB      TAB      Ectopic  1   Multiple      Live Births           Obstetric Comments  Ectopic in 2019 tx with MTX         Home Medications    Prior to Admission medications   Medication Sig Start Date End Date Taking? Authorizing Provider  acetaminophen (TYLENOL) 500 MG tablet Take 1,000 mg by mouth every 6 (six) hours as needed for moderate pain.    [provider]    Family History Family History  Problem Relation Age of Onset  . Hypertension Mother   . Diabetes Father   . Hypertension Father     Social History Social History   Tobacco Use  . Smoking status: Never Smoker  . Smokeless tobacco: Never Used  Substance Use Topics  . Alcohol use: Yes    Comment: occasional wine  . Drug use: No     Allergies   No known allergies   Review of Systems Review of Systems  Constitutional: Positive for activity change, appetite change and fever.  HENT: Positive for congestion, rhinorrhea and sore throat.   Respiratory: Positive for cough. Negative  for chest tightness.   Cardiovascular: Negative for chest pain.  Gastrointestinal: Negative for abdominal pain, nausea and vomiting.  Genitourinary: Negative for dysuria and hematuria.  Musculoskeletal: Positive for arthralgias and myalgias.  Skin: Negative for rash.  Neurological: Positive for weakness and headaches.   all other systems are negative except as noted in the HPI and PMH.     Physical Exam Updated Vital Signs BP (!) 135/97   Pulse 98   Temp (!) 102.2 F (39 C)   Resp 18   Ht 5\' 2"  (1.575 m)   Wt 64.9 kg   LMP 12/17/2018   SpO2 100%   BMI 26.16 kg/m   Physical Exam Vitals signs and nursing note reviewed.  Constitutional:      General: She is not in acute distress.    Appearance: She is well-developed.  HENT:     Head: Normocephalic and atraumatic.     Right Ear: Tympanic membrane normal.     Left Ear: Tympanic membrane normal.      Nose: Congestion and rhinorrhea present.     Mouth/Throat:     Mouth: Mucous membranes are moist.     Pharynx: Oropharyngeal exudate and posterior oropharyngeal erythema present.  Eyes:     Conjunctiva/sclera: Conjunctivae normal.     Pupils: Pupils are equal, round, and reactive to light.  Neck:     Musculoskeletal: Normal range of motion and neck supple.     Comments: No meningismus. Cardiovascular:     Rate and Rhythm: Normal rate and regular rhythm.     Heart sounds: Normal heart sounds. No murmur.  Pulmonary:     Effort: Pulmonary effort is normal. No respiratory distress.     Breath sounds: Normal breath sounds.  Chest:     Chest wall: No tenderness.  Abdominal:     Palpations: Abdomen is soft.     Tenderness: There is no abdominal tenderness. There is no guarding or rebound.  Musculoskeletal: Normal range of motion.        General: No tenderness.  Lymphadenopathy:     Cervical: No cervical adenopathy.  Skin:    General: Skin is warm.     Capillary Refill: Capillary refill takes less than 2 seconds.  Neurological:     General: No focal deficit present.     Mental Status: She is alert and oriented to person, place, and time. Mental status is at baseline.     Cranial Nerves: No cranial nerve deficit.     Motor: No abnormal muscle tone.     Coordination: Coordination normal.     Comments: No ataxia on finger to nose bilaterally. No pronator drift. 5/5 strength throughout. CN 2-12 intact.Equal grip strength. Sensation intact.   Psychiatric:        Behavior: Behavior normal.      ED Treatments / Results  Labs (all labs ordered are listed, but only abnormal results are displayed) Labs Reviewed  CBC WITH DIFFERENTIAL/PLATELET - Abnormal; Notable for the following components:      Result Value   Hemoglobin 11.5 (*)    HCT 34.9 (*)    All other components within normal limits  COMPREHENSIVE METABOLIC PANEL - Abnormal; Notable for the following components:   Potassium  3.1 (*)    BUN 5 (*)    Calcium 8.5 (*)    Total Protein 6.4 (*)    All other components within normal limits  GROUP A STREP BY PCR  NOVEL CORONAVIRUS, NAA (HOSPITAL ORDER, SEND-OUT TO REF  LAB)  INFLUENZA PANEL BY PCR (TYPE A & B)  MONONUCLEOSIS SCREEN    EKG None  Radiology Dg Chest 2 View  Result Date: 01/15/2019 CLINICAL DATA:  Cough and body aches EXAM: CHEST - 2 VIEW COMPARISON:  None. FINDINGS: The heart size and mediastinal contours are within normal limits. Both lungs are clear. The visualized skeletal structures are unremarkable. IMPRESSION: Clear lungs. Electronically Signed   By: Deatra Robinson M.D.   On: 01/15/2019 04:51    Procedures Procedures (including critical care time)  Medications Ordered in ED Medications - No data to display   Initial Impression / Assessment and Plan / ED Course  I have reviewed the triage vital signs and the nursing notes.  Pertinent labs & imaging results that were available during my care of the patient were reviewed by me and considered in my medical decision making (see chart for details).       3 weeks of sore throat now with 1 day of fever, cough and body aches.  Recent travel to [place] with sick contacts.  Patient nontoxic-appearing but is febrile.  Clear lungs with no hypoxia.  Chest x-ray is negative.  Rapid strep is negative and rapid flu test is negative. Monospot negative. WBC normal.   Coronavirus considered given recent travel to [place].  Discussed with infection prevention Angie who agrees with testing and home quarantine.  Patient agrees to home quarantine.  Discussed p.o. hydration, antipyretics and PCP follow-up.  Return precautions discussed including need to inform medical providers that she is under home quarantine.  She will need to return with difficulty breathing, intractable nausea and vomiting, chest pain, shortness of breath or any other concerns. Final Clinical Impressions(s) / ED Diagnoses    Final diagnoses:  Viral URI with cough    ED Discharge Orders    None       Estefanny Moler, Jeannett Senior, MD 01/15/19 [phone]

## 2019-01-15 NOTE — ED Triage Notes (Signed)
The pt is c/o cough dry  Body aches headache fever sorethroat for 3 weeks.. she just had the flu one month ago  lmp last month

## 2019-01-18 LAB — NOVEL CORONAVIRUS, NAA (HOSP ORDER, SEND-OUT TO REF LAB; TAT 18-24 HRS)

## 2019-01-18 LAB — NOVEL CORONAVIRUS, NAA (HOSPITAL ORDER, SEND-OUT TO REF LAB): SARS-COV-2, NAA: DETECTED

## 2019-01-18 NOTE — ED Provider Notes (Signed)
Positive covid test identified Patient questions answered She reports doing well clinically She is doing well clinically with upper respiratory symptoms without fever or dyspnea She is self quarantining and has a primary care physician Chief Technology Officer) to call She understands she will be contacted the health department    Jasmine Grizzle, MD 01/18/19 1448

## 2019-03-28 ENCOUNTER — Other Ambulatory Visit: Payer: Self-pay

## 2019-03-28 ENCOUNTER — Encounter (HOSPITAL_COMMUNITY): Payer: Self-pay | Admitting: *Deleted

## 2019-03-28 ENCOUNTER — Emergency Department (HOSPITAL_COMMUNITY)
Admission: EM | Admit: 2019-03-28 | Discharge: 2019-03-28 | Disposition: A | Discharge status: Home / Self Care | Payer: 59 | Attending: Emergency Medicine | Admitting: Emergency Medicine

## 2019-03-28 DIAGNOSIS — R51 Headache: Secondary | ICD-10-CM | POA: Insufficient documentation

## 2019-03-28 DIAGNOSIS — J029 Acute pharyngitis, unspecified: Secondary | ICD-10-CM | POA: Diagnosis present

## 2019-03-28 DIAGNOSIS — Z20828 Contact with and (suspected) exposure to other viral communicable diseases: Secondary | ICD-10-CM | POA: Insufficient documentation

## 2019-03-28 DIAGNOSIS — M7918 Myalgia, other site: Secondary | ICD-10-CM | POA: Diagnosis not present

## 2019-03-28 DIAGNOSIS — B349 Viral infection, unspecified: Secondary | ICD-10-CM | POA: Insufficient documentation

## 2019-03-28 DIAGNOSIS — R5383 Other fatigue: Secondary | ICD-10-CM | POA: Insufficient documentation

## 2019-03-28 DIAGNOSIS — Z8619 Personal history of other infectious and parasitic diseases: Secondary | ICD-10-CM | POA: Insufficient documentation

## 2019-03-28 LAB — BASIC METABOLIC PANEL
Anion gap: 8 (ref 5–15)
BUN: 8 mg/dL (ref 6–20)
CO2: 24 mmol/L (ref 22–32)
Calcium: 8.8 mg/dL — ABNORMAL LOW (ref 8.9–10.3)
Chloride: 105 mmol/L (ref 98–111)
Creatinine, Ser: 0.65 mg/dL (ref 0.44–1.00)
GFR calc Af Amer: >60 mL/min (ref >60)
GFR calc non Af Amer: >60 mL/min (ref >60)
Glucose, Bld: 100 mg/dL — ABNORMAL HIGH (ref 70–99)
Potassium: 3.7 mmol/L (ref 3.5–5.1)
Sodium: 137 mmol/L (ref 135–145)

## 2019-03-28 LAB — CBC WITH DIFFERENTIAL/PLATELET
Abs Immature Granulocytes: 0.01 10*3/uL (ref 0.00–0.07)
Basophils Absolute: 0 10*3/uL (ref 0.0–0.1)
Basophils Relative: 1 %
Eosinophils Absolute: 0.3 10*3/uL (ref 0.0–0.5)
Eosinophils Relative: 4 %
HCT: 33.2 % — ABNORMAL LOW (ref 36.0–46.0)
Hemoglobin: 10.8 g/dL — ABNORMAL LOW (ref 12.0–15.0)
Immature Granulocytes: 0 %
Lymphocytes Relative: 41 %
Lymphs Abs: 2.7 10*3/uL (ref 0.7–4.0)
MCH: 29.5 pg (ref 26.0–34.0)
MCHC: 32.5 g/dL (ref 30.0–36.0)
MCV: 90.7 fL (ref 80.0–100.0)
Monocytes Absolute: 0.6 10*3/uL (ref 0.1–1.0)
Monocytes Relative: 9 %
Neutro Abs: 2.9 10*3/uL (ref 1.7–7.7)
Neutrophils Relative %: 45 %
Platelets: 259 10*3/uL (ref 150–400)
RBC: 3.66 MIL/uL — ABNORMAL LOW (ref 3.87–5.11)
RDW: 12.8 % (ref 11.5–15.5)
WBC: 6.5 10*3/uL (ref 4.0–10.5)
nRBC: 0 % (ref 0.0–0.2)

## 2019-03-28 LAB — GROUP A STREP BY PCR: Group A Strep by PCR: NOT DETECTED

## 2019-03-28 NOTE — ED Provider Notes (Signed)
[facility] EMERGENCY DEPARTMENT Provider Note   CSN: [id number] Arrival date & time: 03/28/19  1920    History   Chief Complaint Chief Complaint  Patient presents with  . Sore Throat    HPI Jasmine Mckenzie is a 38 y.o. female with history of GERD, IBS who presents with a 5-day history of sore throat, fatigue, headache, myalgias.  Patient denies any cough or significant shortness of breath.  She denies any chest pain.  She has had some abdominal discomfort, however this is normal for her with her IBS.  She denies any nausea or vomiting.  Patient had COVID-19 in March and recovered.  She had a visit with her doctor today and was thought to possibly have a relapse of COVID.  She was sent here for further evaluation.  She reports she feels very fatigued and at times this makes her feel short of breath, although she feels it is mainly because she is so tired.     HPI  Past Medical History:  Diagnosis Date  . Arthritis   . GERD (gastroesophageal reflux disease)   . Heart palpitations   . IBS (irritable bowel syndrome)     Patient Active Problem List   Diagnosis Date Noted  . Chronic cholecystitis with calculus 08/17/2016    Past Surgical History:  Procedure Laterality Date  . BREAST SURGERY     augmentation  . CHOLECYSTECTOMY N/A 08/17/2016   Procedure: LAPAROSCOPIC CHOLECYSTECTOMY WITH INTRAOPERATIVE CHOLANGIOGRAM;  Surgeon: Manus Rudd, MD;  Location: [facility] OR;  Service: General;  Laterality: N/A;  . UMBILICAL HERNIA REPAIR N/A 08/17/2016   Procedure: HERNIA REPAIR UMBILICAL ADULT;  Surgeon: Manus Rudd, MD;  Location: [facility] OR;  Service: General;  Laterality: N/A;     OB History    Gravida  5   Para  4   Term  4   Preterm      AB  1   Living  4     SAB      TAB      Ectopic  1   Multiple      Live Births           Obstetric Comments  Ectopic in 2019 tx with MTX         Home Medications    Prior to Admission medications    Medication Sig Start Date End Date Taking? Authorizing Provider  acetaminophen (TYLENOL) 500 MG tablet Take 1,000 mg by mouth every 6 (six) hours as needed for moderate pain.    [provider]    Family History Family History  Problem Relation Age of Onset  . Hypertension Mother   . Diabetes Father   . Hypertension Father     Social History Social History   Tobacco Use  . Smoking status: Never Smoker  . Smokeless tobacco: Never Used  Substance Use Topics  . Alcohol use: Yes    Comment: occasional wine  . Drug use: No     Allergies   No known allergies   Review of Systems Review of Systems  Constitutional: Positive for fatigue. Negative for chills and fever.  HENT: Positive for sore throat. Negative for ear pain and facial swelling.   Respiratory: Negative for cough and shortness of breath.   Cardiovascular: Negative for chest pain.  Gastrointestinal: Positive for abdominal pain (chronic). Negative for nausea and vomiting.  Genitourinary: Negative for dysuria.  Musculoskeletal: Positive for neck pain (feels like lymph nodes are swollen). Negative for back  pain.  Skin: Negative for rash and wound.  Neurological: Negative for headaches.  Psychiatric/Behavioral: The patient is not nervous/anxious.      Physical Exam Updated Vital Signs BP 131/87   Pulse 66   Temp 98.6 F (37 C) (Oral)   Resp 16   Ht 5\' 2"  (1.575 m)   Wt 63.5 kg   LMP 02/26/2019   SpO2 100%   BMI 25.61 kg/m   Physical Exam Vitals signs and nursing note reviewed.  Constitutional:      General: She is not in acute distress.    Appearance: She is well-developed. She is not diaphoretic.  HENT:     Head: Normocephalic and atraumatic.     Right Ear: Tympanic membrane normal.     Left Ear: Tympanic membrane normal.     Mouth/Throat:     Pharynx: No pharyngeal swelling, oropharyngeal exudate or posterior oropharyngeal erythema.     Tonsils: No tonsillar exudate or tonsillar  abscesses. 1+ on the right. 1+ on the left.  Eyes:     General: No scleral icterus.       Right eye: No discharge.        Left eye: No discharge.     Conjunctiva/sclera: Conjunctivae normal.     Pupils: Pupils are equal, round, and reactive to light.  Neck:     Musculoskeletal: Normal range of motion and neck supple.     Thyroid: No thyromegaly.  Cardiovascular:     Rate and Rhythm: Normal rate and regular rhythm.     Heart sounds: Normal heart sounds. No murmur. No friction rub. No gallop.   Pulmonary:     Effort: Pulmonary effort is normal. No respiratory distress.     Breath sounds: Normal breath sounds. No stridor. No wheezing or rales.  Abdominal:     General: Bowel sounds are normal. There is no distension.     Palpations: Abdomen is soft.     Tenderness: There is no abdominal tenderness. There is no guarding or rebound.  Lymphadenopathy:     Cervical: No cervical adenopathy.  Skin:    General: Skin is warm and dry.     Coloration: Skin is not pale.     Findings: No rash.  Neurological:     Mental Status: She is alert.     Coordination: Coordination normal.      ED Treatments / Results  Labs (all labs ordered are listed, but only abnormal results are displayed) Labs Reviewed  CBC WITH DIFFERENTIAL/PLATELET - Abnormal; Notable for the following components:      Result Value   RBC 3.66 (*)    Hemoglobin 10.8 (*)    HCT 33.2 (*)    All other components within normal limits  BASIC METABOLIC PANEL - Abnormal; Notable for the following components:   Glucose, Bld 100 (*)    Calcium 8.8 (*)    All other components within normal limits  GROUP A STREP BY PCR  NOVEL CORONAVIRUS, NAA (HOSPITAL ORDER, SEND-OUT TO REF LAB)    EKG None  Radiology No results found.  Procedures Procedures (including critical care time)  Medications Ordered in ED Medications - No data to display   Initial Impression / Assessment and Plan / ED Course  I have reviewed the triage  vital signs and the nursing notes.  Pertinent labs & imaging results that were available during my care of the patient were reviewed by me and considered in my medical decision making (see chart for details).  Patient resenting with a 5-day history of throat.  She has had associated generalized fatigue.  Labs do show anemia, however it is not much worse than patient's baseline.  Patient is already taking iron.  Strep is negative.  Coronavirus test pending.  Rest, fluids discussed.  Tylenol for pain and body aches.  Vitals are stable.  Patient is in no acute distress.  No further interventions indicated at this time.  Follow-up to PCP as needed.  Return precautions discussed.  Patient understands and agrees with plan.  Patient discharged in satisfactory condition.  Final Clinical Impressions(s) / ED Diagnoses   Final diagnoses:  Viral syndrome  Sore throat    ED Discharge Orders    None       Emi Holes, Cordelia Poche 03/28/19 2217    Wynetta Fines, MD 04/03/19 1459

## 2019-03-28 NOTE — Discharge Instructions (Addendum)
Make sure to get plenty of rest and drink plenty of fluids.  You can take Tylenol as prescribed over-the-counter, as needed for achiness and throat pain.  You will be called in 2 to 4 days with your coronavirus test result.  You can also follow this on MyChart.  Please return to the emergency department if you develop any new or worsening symptoms including inability to swallow your saliva, large masses in your neck, intractable vomiting, or severe difficulty breathing.

## 2019-03-28 NOTE — ED Triage Notes (Signed)
The pt is c/o a sorethroat for 5 days no temp fatigue headache    lmp may

## 2019-03-30 LAB — NOVEL CORONAVIRUS, NAA (HOSP ORDER, SEND-OUT TO REF LAB; TAT 18-24 HRS): SARS-CoV-2, NAA: NOT DETECTED

## 2019-04-17 ENCOUNTER — Ambulatory Visit (INDEPENDENT_AMBULATORY_CARE_PROVIDER_SITE_OTHER): Payer: 59

## 2019-04-17 ENCOUNTER — Other Ambulatory Visit: Payer: Self-pay

## 2019-04-17 ENCOUNTER — Ambulatory Visit (HOSPITAL_COMMUNITY)
Admission: EM | Admit: 2019-04-17 | Discharge: 2019-04-17 | Disposition: A | Discharge status: Home / Self Care | Payer: 59 | Attending: Emergency Medicine | Admitting: Emergency Medicine

## 2019-04-17 ENCOUNTER — Encounter (HOSPITAL_COMMUNITY): Payer: Self-pay

## 2019-04-17 DIAGNOSIS — S161XXA Strain of muscle, fascia and tendon at neck level, initial encounter: Secondary | ICD-10-CM

## 2019-04-17 DIAGNOSIS — S4992XA Unspecified injury of left shoulder and upper arm, initial encounter: Secondary | ICD-10-CM

## 2019-04-17 DIAGNOSIS — S39012A Strain of muscle, fascia and tendon of lower back, initial encounter: Secondary | ICD-10-CM

## 2019-04-17 DIAGNOSIS — S40012A Contusion of left shoulder, initial encounter: Secondary | ICD-10-CM

## 2019-04-17 MED ORDER — KETOROLAC TROMETHAMINE 30 MG/ML IJ SOLN
INTRAMUSCULAR | Status: AC
  Filled 2019-04-17: qty 1

## 2019-04-17 MED ORDER — TIZANIDINE HCL 4 MG PO TABS: 4.0000 mg | ORAL_TABLET | Freq: Three times a day (TID) | ORAL | 0 refills | Status: DC | PRN

## 2019-04-17 MED ORDER — IBUPROFEN 600 MG PO TABS: 600.0000 mg | ORAL_TABLET | Freq: Four times a day (QID) | ORAL | 0 refills | Status: DC | PRN

## 2019-04-17 MED ORDER — HYDROCODONE-ACETAMINOPHEN 5-325 MG PO TABS
ORAL_TABLET | ORAL | Status: AC
  Filled 2019-04-17: qty 1

## 2019-04-17 MED ORDER — HYDROCODONE-ACETAMINOPHEN 5-325 MG PO TABS
1.0000 | ORAL_TABLET | Freq: Once | ORAL | Status: AC
  Administered 2019-04-17: 1 via ORAL

## 2019-04-17 MED ORDER — HYDROCODONE-ACETAMINOPHEN 5-325 MG PO TABS: 1.0000 | ORAL_TABLET | Freq: Four times a day (QID) | ORAL | 0 refills | Status: DC | PRN

## 2019-04-17 MED ORDER — KETOROLAC TROMETHAMINE 30 MG/ML IJ SOLN
30.0000 mg | Freq: Once | INTRAMUSCULAR | Status: AC
  Administered 2019-04-17: 30 mg via INTRAMUSCULAR
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
## 2019-04-17 NOTE — Discharge Instructions (Addendum)
People tend to feel worse over the next several days, but most people are back to normal in 1 week. A small number of people will have persistent pain for up to six weeks. Take the 600 mg of ibuprofen with a Tylenol containing product 3 or 4 times a day on a regular basis for the next several days. You may take 600 mg of ibuprofen with 1 g of Tylenol for mild to moderate pain, 600 mg of ibuprofen with 1-2 tabs of Norco for severe pain.   Do not take the norco if you are taking the tylenol, as they both have tylenol in them, and too much can hurt your liver. Do not exceed 4 grams of tylenol per day from all sources.    Some people may require physical therapy. Early range of motion neck exercises has been shown to speed recovery. Start doing them as soon as possible. Start doing small range and amplitude movements of your neck, first in one direction, then the other. Repeat this 10 times in each direction every hour while awake. Do these to the maximum comfortable range. You may do this sitting up or lying down.  Go to www.goodrx.com to look up your medications. This will give you a list of where you can find your prescriptions at the most affordable prices. Or ask the pharmacist what the cash price is, or if they have any other discount programs available to help make your medication more affordable. This can be less expensive than what you would pay with insurance.

## 2019-04-23 ENCOUNTER — Encounter: Payer: Self-pay | Admitting: Physical Therapy

## 2019-04-23 ENCOUNTER — Other Ambulatory Visit: Payer: Self-pay

## 2019-04-23 ENCOUNTER — Ambulatory Visit: Payer: 59 | Attending: Internal Medicine | Admitting: Physical Therapy

## 2019-04-23 DIAGNOSIS — M25512 Pain in left shoulder: Secondary | ICD-10-CM | POA: Insufficient documentation

## 2019-04-23 DIAGNOSIS — M25612 Stiffness of left shoulder, not elsewhere classified: Secondary | ICD-10-CM | POA: Insufficient documentation

## 2019-04-23 DIAGNOSIS — M545 Low back pain, unspecified: Secondary | ICD-10-CM

## 2019-04-23 DIAGNOSIS — R42 Dizziness and giddiness: Secondary | ICD-10-CM | POA: Insufficient documentation

## 2019-04-23 DIAGNOSIS — M6281 Muscle weakness (generalized): Secondary | ICD-10-CM | POA: Diagnosis present
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
## 2019-04-27 ENCOUNTER — Encounter

## 2019-05-01 ENCOUNTER — Ambulatory Visit: Payer: 59 | Admitting: Physical Therapy

## 2019-05-01 ENCOUNTER — Encounter: Payer: Self-pay | Admitting: Physical Therapy

## 2019-05-01 ENCOUNTER — Other Ambulatory Visit: Payer: Self-pay

## 2019-05-01 DIAGNOSIS — M6281 Muscle weakness (generalized): Secondary | ICD-10-CM

## 2019-05-01 DIAGNOSIS — M25612 Stiffness of left shoulder, not elsewhere classified: Secondary | ICD-10-CM

## 2019-05-01 DIAGNOSIS — M545 Low back pain, unspecified: Secondary | ICD-10-CM

## 2019-05-01 DIAGNOSIS — M25512 Pain in left shoulder: Secondary | ICD-10-CM

## 2019-05-01 DIAGNOSIS — R42 Dizziness and giddiness: Secondary | ICD-10-CM

## 2019-05-01 NOTE — Therapy (Signed)
[facility] [address] [place], [place], [address] Phone: [phone]   Fax:  [phone]  Physical Therapy Treatment  Patient Details  Name: Jasmine SimsLinda Ann Mckenzie MRN: [id number] Date of Birth: [birth year] Referring Provider (PT): Georgann HousekeeperHusain, Karrar, MD   Encounter Date: 05/01/2019  PT End of Session - 05/01/19 1035    Visit Number  2    Number of Visits  17    Date for PT Re-Evaluation  06/18/19    PT Start Time  1035    PT Stop Time  1131    PT Time Calculation (min)  56 min    Activity Tolerance  Patient limited by pain       Past Medical History:  Diagnosis Date  . Arthritis   . GERD (gastroesophageal reflux disease)   . Heart palpitations   . IBS (irritable bowel syndrome)     Past Surgical History:  Procedure Laterality Date  . BREAST SURGERY     augmentation  . CHOLECYSTECTOMY N/A 08/17/2016   Procedure: LAPAROSCOPIC CHOLECYSTECTOMY WITH INTRAOPERATIVE CHOLANGIOGRAM;  Surgeon: Manus RuddMatthew Tsuei, MD;  Location: [facility] OR;  Service: General;  Laterality: N/A;  . UMBILICAL HERNIA REPAIR N/A 08/17/2016   Procedure: HERNIA REPAIR UMBILICAL ADULT;  Surgeon: Manus RuddMatthew Tsuei, MD;  Location: [facility] OR;  Service: General;  Laterality: N/A;    There were no vitals filed for this visit.  Subjective Assessment - 05/01/19 1035    Subjective  Pt reports she has been using heat and ice along with the HEP and is getting better, today her pain is mainly in the Lt shoulder and neck, low back is good. She states her pain seems to be moving around    Currently in Pain?  Yes    Pain Score  6     Pain Location  Shoulder    Pain Orientation  Left   top   Pain Descriptors / Indicators  Aching    Pain Type  Acute pain    Pain Radiating Towards  into her neck    Pain Onset  1 to 4 weeks ago    Pain Frequency  Constant    Aggravating Factors   lifting the arm and moving her neck    Pain Relieving Factors  heat and OTC meds                        [facility] Adult PT Treatment/Exercise - 05/01/19 0001      Exercises   Exercises  Shoulder;Lumbar      Lumbar Exercises: Supine   Bridge  20 reps;Compliant      Shoulder Exercises: Supine   Other Supine Exercises  5x3sec head presses into pillow and shoulder presses      Shoulder Exercises: Standing   Row  Strengthening;Both;20 reps;Theraband   elbows at 90 and elbows straight   Theraband Level (Shoulder Row)  Level 2 (Red)      Shoulder Exercises: Pulleys   Flexion  2 minutes    Scaption  2 minutes      Shoulder Exercises: Isometric Strengthening   External Rotation  5X5"   palm up/thumb up/palm down     Modalities   Modalities  Moist Heat      Moist Heat Therapy   Number Minutes Moist Heat  12 Minutes    Moist Heat Location  Cervical;Shoulder   lt     Manual Therapy   Manual Therapy  Soft tissue mobilization;Joint mobilization  Joint Mobilization  Lt shoulder distraction and AP mobs along with circular, grade II cervical mobs     Soft tissue mobilization  STM to Lt shoulder ER, pecs, upper trap, bilat SCM, occipital base release, STM to bilat cervical paraspinals                PT Short Term Goals - 04/23/19 1127      PT SHORT TERM GOAL #1   Title  pt to be I with inital HEP    Time  4    Period  Weeks    Status  New    Target Date  05/21/19      PT SHORT TERM GOAL #2   Title  pt to verbalize and demo proper posture and lifting mechanics to reduce and prevent back and shoulder pain    Time  4    Period  Weeks    Status  New    Target Date  05/21/19      PT SHORT TERM GOAL #3   Title  pt to increase L shoulder ROM by >/= 15 degrees in all planes to promote functional ROM with </= 5/10 pain    Time  4    Period  Weeks    Status  New    Target Date  05/21/19        PT Long Term Goals - 04/23/19 1129      PT LONG TERM GOAL #1   Title  increase L shoulder ROM to [facility] compared bil with </= 2/10 pain for  functional mobility required for ADLs    Time  8    Period  Weeks    Status  New    Target Date  06/18/19      PT LONG TERM GOAL #2   Title  pt to increase trunk flexion/ extension to [facility] with no report of pain or spasm to promote proper posture and lifting mechanics    Time  6    Period  Weeks    Status  New    Target Date  06/04/19      PT LONG TERM GOAL #3   Title  pt to be able to lift and lower / push / pull >/= 20# for work related activites with no report of shoulder limitations/pain    Time  6    Period  Weeks    Status  New    Target Date  06/04/19      PT LONG TERM GOAL #4   Title  increase FOTO score to </= 33% limited to demo improvement in function    Time  8    Period  Weeks    Status  New    Target Date  06/18/19      PT LONG TERM GOAL #5   Title  pt to be I with all HEP given as of last visit to maintain and progress current level of function    Time  8    Period  Weeks    Status  New    Target Date  06/18/19            Plan - 05/01/19 1044    Clinical Impression Statement  This is [name]'s first treatment after her eval.  She reports her HEP is helping her feel better. Her primary concern today was the Lt shoulder and neck.  She has a lot of guarding and it was difficult to perform GH jt mobs.  Significant trigger points in the Lt upper trap and SCM, these decreased slightly with manual work.  She is not interested in DN at this time.    Rehab Potential  Good    PT Frequency  2x / week    PT Duration  8 weeks    PT Treatment/Interventions  ADLs/Self Care Home Management;Cryotherapy;Electrical Stimulation;Iontophoresis 4mg /ml Dexamethasone;Moist Heat;Traction;Ultrasound;Functional mobility training;Therapeutic activities;Therapeutic exercise;Neuromuscular re-education;Manual techniques;Passive range of motion;Dry needling;Taping;Vasopneumatic Device    PT Next Visit Plan  progress HEP for neck, low back and shoulder, manual work and modalities PRN        Patient will benefit from skilled therapeutic intervention in order to improve the following deficits and impairments:  Pain, Postural dysfunction, Improper body mechanics, Decreased strength, Decreased range of motion, Decreased activity tolerance, Decreased endurance, Increased muscle spasms  Visit Diagnosis: 1. Acute left-sided low back pain without sciatica   2. Acute pain of left shoulder   3. Muscle weakness (generalized)   4. Stiffness of left shoulder, not elsewhere classified   5. Dizziness and giddiness        Problem List Patient Active Problem List   Diagnosis Date Noted  . Chronic cholecystitis with calculus 08/17/2016    Jeral Pinch PT  05/01/2019, 11:25 AM  [facility] [address] [place], [place], [address] Phone: [phone]   Fax:  [phone]  Name: Jasmine Mckenzie MRN: [id number] Date of Birth: 1981/03/14

## 2019-05-02 ENCOUNTER — Encounter: Payer: Self-pay | Admitting: Physical Therapy

## 2019-05-02 ENCOUNTER — Ambulatory Visit: Payer: 59 | Attending: Internal Medicine | Admitting: Physical Therapy

## 2019-05-02 DIAGNOSIS — M545 Low back pain, unspecified: Secondary | ICD-10-CM

## 2019-05-02 DIAGNOSIS — M25512 Pain in left shoulder: Secondary | ICD-10-CM | POA: Insufficient documentation

## 2019-05-02 DIAGNOSIS — M25612 Stiffness of left shoulder, not elsewhere classified: Secondary | ICD-10-CM | POA: Diagnosis present

## 2019-05-02 DIAGNOSIS — R42 Dizziness and giddiness: Secondary | ICD-10-CM | POA: Diagnosis present

## 2019-05-02 DIAGNOSIS — M6281 Muscle weakness (generalized): Secondary | ICD-10-CM | POA: Diagnosis present

## 2019-05-02 NOTE — Therapy (Signed)
[facility] [address] [place], [place], [address] Phone: [phone]   Fax:  [phone]  Physical Therapy Treatment  Patient Details  Name: Jasmine SimsLinda Ann Mckenzie MRN: [id number] Date of Birth: 07-08-1981 Referring Provider (PT): Georgann HousekeeperHusain, Karrar, MD   Encounter Date: 05/02/2019  PT End of Session - 05/02/19 1600    Visit Number  3    Number of Visits  17    Date for PT Re-Evaluation  06/18/19    PT Start Time  0300    PT Stop Time  0355    PT Time Calculation (min)  55 min       Past Medical History:  Diagnosis Date  . Arthritis   . GERD (gastroesophageal reflux disease)   . Heart palpitations   . IBS (irritable bowel syndrome)     Past Surgical History:  Procedure Laterality Date  . BREAST SURGERY     augmentation  . CHOLECYSTECTOMY N/A 08/17/2016   Procedure: LAPAROSCOPIC CHOLECYSTECTOMY WITH INTRAOPERATIVE CHOLANGIOGRAM;  Surgeon: Manus RuddMatthew Tsuei, MD;  Location[facility];  Service: General;  Laterality: N/A;  . UMBILICAL HERNIA REPAIR N/A 08/17/2016   Procedure: HERNIA REPAIR UMBILICAL ADULT;  Surgeon: Manus RuddMatthew Tsuei, MD;  [facility];  Service: General;  Laterality: N/A;    There were no vitals filed for this visit.  Subjective Assessment - 05/02/19 1503    Subjective  I have to sleep on right side and I am used to sleeping on my stomach.    Currently in Pain?  Yes    Pain Score  8     Pain Location  Shoulder    Pain Orientation  Left    Pain Descriptors / Indicators  Aching;Sore    Aggravating Factors   sleep positioning    Pain Relieving Factors  heat and OTC meds, massage helps short term    Pain Score  7    Pain Location  Back    Pain Orientation  Lower;Left    Pain Descriptors / Indicators  Aching;Sore;Tightness    Aggravating Factors   lifting, standing, walking    Pain Relieving Factors  heat                       [facility] Adult PT Treatment/Exercise - 05/02/19 0001      Self-Care   Self-Care  Posture    Posture  Sleep positions on side with towel support at neck and pillow under left arm, pt reports this is comfortable.       Lumbar Exercises: Supine   Pelvic Tilt  20 reps    Bridge  20 reps;Compliant    Bridge Limitations  with initial pelvic tilit, segmental       Knee/Hip Exercises: Stretches   Other Knee/Hip Stretches  lower trunk rotation 2 x 10      Shoulder Exercises: Supine   Other Supine Exercises  5x3sec head presses into pillow and shoulder presses    Other Supine Exercises  supine cane  pressups, pullovers, ER       Moist Heat Therapy   Number Minutes Moist Heat  12 Minutes    Moist Heat Location  Shoulder      Manual Therapy   Joint Mobilization  Scap mobs    Soft tissue mobilization  STM left posterior shoulder                PT Short Term Goals - 04/23/19 1127      PT  SHORT TERM GOAL #1   Title  pt to be I with inital HEP    Time  4    Period  Weeks    Status  New    Target Date  05/21/19      PT SHORT TERM GOAL #2   Title  pt to verbalize and demo proper posture and lifting mechanics to reduce and prevent back and shoulder pain    Time  4    Period  Weeks    Status  New    Target Date  05/21/19      PT SHORT TERM GOAL #3   Title  pt to increase L shoulder ROM by >/= 15 degrees in all planes to promote functional ROM with </= 5/10 pain    Time  4    Period  Weeks    Status  New    Target Date  05/21/19        PT Long Term Goals - 04/23/19 1129      PT LONG TERM GOAL #1   Title  increase L shoulder ROM to [facility]FL compared bil with </= 2/10 pain for functional mobility required for ADLs    Time  8    Period  Weeks    Status  New    Target Date  06/18/19      PT LONG TERM GOAL #2   Title  pt to increase trunk flexion/ extension to [facility] with no report of pain or spasm to promote proper posture and lifting mechanics    Time  6    Period  Weeks    Status  New    Target Date  06/04/19      PT LONG TERM GOAL #3    Title  pt to be able to lift and lower / push / pull >/= 20# for work related activites with no report of shoulder limitations/pain    Time  6    Period  Weeks    Status  New    Target Date  06/04/19      PT LONG TERM GOAL #4   Title  increase FOTO score to </= 33% limited to demo improvement in function    Time  8    Period  Weeks    Status  New    Target Date  06/18/19      PT LONG TERM GOAL #5   Title  pt to be I with all HEP given as of last visit to maintain and progress current level of function    Time  8    Period  Weeks    Status  New    Target Date  06/18/19            Plan - 05/02/19 1640    Clinical Impression Statement  Pt reports continued pain in left shoulder and left low back. She felt manual was helpful last session until she slept and woke up tight and painful again. Educated pt on side lying sleep posture with neck and shoulder support. She rpeorts this is comfortable. She has difficulty staying on her side whle sleeping.    PT Next Visit Plan  progress HEP for neck, low back and shoulder, manual work and modalities PRN (ionto or US to posterior shoulder if cert is signed)    PT Home Exercise Plan  lower trunk rotation, hamstring stretching, SLR, shoulder table slides (flexion/ abduction)       Patient will benefit from skilled  therapeutic intervention in order to improve the following deficits and impairments:  Pain, Postural dysfunction, Improper body mechanics, Decreased strength, Decreased range of motion, Decreased activity tolerance, Decreased endurance, Increased muscle spasms  Visit Diagnosis: 1. Acute left-sided low back pain without sciatica   2. Acute pain of left shoulder   3. Muscle weakness (generalized)   4. Stiffness of left shoulder, not elsewhere classified   5. Dizziness and giddiness        Problem List Patient Active Problem List   Diagnosis Date Noted  . Chronic cholecystitis with calculus 08/17/2016    Jasmine Mckenzie , PTA 05/02/2019, 4:42 PM  [facility] [address] [place], [place], [address] Phone: [phone]   Fax:  [phone]  Name: Jasmine Mckenzie MRN: [id number] Date of Birth: January 21, 1981

## 2019-05-14 ENCOUNTER — Telehealth: Payer: Self-pay | Admitting: Physical Therapy

## 2019-05-14 ENCOUNTER — Ambulatory Visit: Payer: 59 | Admitting: Physical Therapy

## 2019-05-14 NOTE — Telephone Encounter (Signed)
Spoke with pt to make sure she was doing well due to missing her scheduled appointment at 2. She reported that she called the front prior to her appointment notifying them that she would be late and since it was going to be beyond 15 min was instructed to reschedule that appointment. She couldn't remember who she spoke to.   Discussed with her that her next appointment is scheduled on Wednesday and that she plans to attend. Reminded her to call like she did today if she is running late or is unable to make it and we can cancel or reschedule that appointment.

## 2019-05-16 ENCOUNTER — Ambulatory Visit: Payer: 59 | Admitting: Physical Therapy

## 2019-05-16 ENCOUNTER — Encounter: Payer: Self-pay | Admitting: Physical Therapy

## 2019-05-16 ENCOUNTER — Other Ambulatory Visit: Payer: Self-pay

## 2019-05-16 DIAGNOSIS — M25512 Pain in left shoulder: Secondary | ICD-10-CM

## 2019-05-16 DIAGNOSIS — M545 Low back pain, unspecified: Secondary | ICD-10-CM

## 2019-05-16 DIAGNOSIS — M25612 Stiffness of left shoulder, not elsewhere classified: Secondary | ICD-10-CM

## 2019-05-16 DIAGNOSIS — M6281 Muscle weakness (generalized): Secondary | ICD-10-CM

## 2019-05-16 NOTE — Therapy (Signed)
[place], [place], [address] Phone: [phone]   Fax:  [phone]  Physical Therapy Treatment  Patient Details  Name: Jasmine Mckenzie MRN: [id number] Date of Birth: 13-Jan-1981 Referring Provider (PT): Wenda Low, MD   Encounter Date: 05/16/2019  PT End of Session - 05/16/19 1235    Visit Number  4    Number of Visits  17    Date for PT Re-Evaluation  06/18/19    PT Start Time  8032    PT Stop Time  1318    PT Time Calculation (min)  45 min    Activity Tolerance  Patient tolerated treatment well    Behavior During Therapy  [facility] for tasks assessed/performed       Past Medical History:  Diagnosis Date  . Arthritis   . GERD (gastroesophageal reflux disease)   . Heart palpitations   . IBS (irritable bowel syndrome)     Past Surgical History:  Procedure Laterality Date  . BREAST SURGERY     augmentation  . CHOLECYSTECTOMY N/A 08/17/2016   Procedure: LAPAROSCOPIC CHOLECYSTECTOMY WITH INTRAOPERATIVE CHOLANGIOGRAM;  Surgeon: Donnie Mesa, MD;  Location: [facility];  Service: General;  Laterality: N/A;  . UMBILICAL HERNIA REPAIR N/A 08/17/2016   Procedure: HERNIA REPAIR UMBILICAL ADULT;  Surgeon: Donnie Mesa, MD;  Location: [facility];  Service: General;  Laterality: N/A;    There were no vitals filed for this visit.  Subjective Assessment - 05/16/19 1235    Subjective  " I am feeling more sore today, my neck, back  and shoulder and I returned work with limited duty but still am feeling sore"    Currently in Pain?  Yes    Pain Score  8     Pain Orientation  Left    Pain Descriptors / Indicators  Aching    Pain Type  Chronic pain    Pain Onset  1 to 4 weeks ago    Pain Frequency  Constant    Aggravating Factors   sleeping positioning    Pain Score  9    Pain Location  Back    Pain Orientation  Right;Left    Pain Descriptors / Indicators  Aching    Pain Type  Chronic pain    Pain Frequency   Intermittent    Aggravating Factors   lifting, standing, walking,                       [facility] Adult PT Treatment/Exercise - 05/16/19 0001      Self-Care   Posture  MTPR techniques and how to perform at home      Knee/Hip Exercises: Stretches   Active Hamstring Stretch  3 reps;Right;30 seconds   PNF contract/ relax   Other Knee/Hip Stretches  lower trunk rotation 2 x 10      Knee/Hip Exercises: Supine   Straight Leg Raises  1 set;15 reps;Both;Strengthening      Modalities   Modalities  Electrical Stimulation      Moist Heat Therapy   Number Minutes Moist Heat  10 Minutes    Moist Heat Location  Shoulder      Electrical Stimulation   Electrical Stimulation Location  bil upper trap    Electrical Stimulation Action  pre-mod    Electrical Stimulation Parameters  L 7 x 10 min     Electrical Stimulation Goals  Pain      Manual Therapy   Manual Therapy  Other (comment)    Manual therapy comments  MTPR along the upper trap x 2    educated how to perform at home   Joint Mobilization  bil first rib mobs    Soft tissue mobilization  STM left posterior shoulder     Other Manual Therapy  L upper trap inhibition tpaing    Muscle Energy Technique  Resisted R hip flexor / L hip extensor 10 x 5 sec hold             PT Education - 05/16/19 1247    Education Details  how to perform MTPR techniques    Person(s) Educated  Patient    Methods  Explanation;Verbal cues    Comprehension  Verbalized understanding;Verbal cues required       PT Short Term Goals - 04/23/19 1127      PT SHORT TERM GOAL #1   Title  pt to be I with inital HEP    Time  4    Period  Weeks    Status  New    Target Date  05/21/19      PT SHORT TERM GOAL #2   Title  pt to verbalize and demo proper posture and lifting mechanics to reduce and prevent back and shoulder pain    Time  4    Period  Weeks    Status  New    Target Date  05/21/19      PT SHORT TERM GOAL #3   Title  pt to  increase L shoulder ROM by >/= 15 degrees in all planes to promote functional ROM with </= 5/10 pain    Time  4    Period  Weeks    Status  New    Target Date  05/21/19        PT Long Term Goals - 04/23/19 1129      PT LONG TERM GOAL #1   Title  increase L shoulder ROM to [facility] compared bil with </= 2/10 pain for functional mobility required for ADLs    Time  8    Period  Weeks    Status  New    Target Date  06/18/19      PT LONG TERM GOAL #2   Title  pt to increase trunk flexion/ extension to [facility] with no report of pain or spasm to promote proper posture and lifting mechanics    Time  6    Period  Weeks    Status  New    Target Date  06/04/19      PT LONG TERM GOAL #3   Title  pt to be able to lift and lower / push / pull >/= 20# for work related activites with no report of shoulder limitations/pain    Time  6    Period  Weeks    Status  New    Target Date  06/04/19      PT LONG TERM GOAL #4   Title  increase FOTO score to </= 33% limited to demo improvement in function    Time  8    Period  Weeks    Status  New    Target Date  06/18/19      PT LONG TERM GOAL #5   Title  pt to be I with all HEP given as of last visit to maintain and progress current level of function    Time  8    Period  Weeks  Status  New    Target Date  06/18/19            Plan - 05/16/19 1317    Clinical Impression Statement  increased pain reported with the neck/ back which pt attributes to returning to work. educated MTPR to relieve tension, pt opted to not have DN. continued [employer] for the low back. she reported end of session back felt better continued sorenessin the shoulders. utilized Stim with HEP to calm down pain.       Patient will benefit from skilled therapeutic intervention in order to improve the following deficits and impairments:  Pain, Postural dysfunction, Improper body mechanics, Decreased strength, Decreased range of motion, Decreased activity tolerance,  Decreased endurance, Increased muscle spasms  Visit Diagnosis: 1. Acute left-sided low back pain without sciatica   2. Acute pain of left shoulder   3. Muscle weakness (generalized)   4. Stiffness of left shoulder, not elsewhere classified        Problem List Patient Active Problem List   Diagnosis Date Noted  . Chronic cholecystitis with calculus 08/17/2016   Starr Lake PT, DPT, LAT, ATC  05/16/19  1:21 PM      [facility] [address] [facility], [place], [address] Phone: [phone]   Fax:  [phone]  Name: Jasmine Mckenzie MRN: [id number] Date of Birth: April 02, 1981

## 2019-05-19 ENCOUNTER — Other Ambulatory Visit: Payer: Self-pay | Admitting: *Deleted

## 2019-05-19 DIAGNOSIS — Z20822 Contact with and (suspected) exposure to covid-19: Secondary | ICD-10-CM

## 2019-05-21 ENCOUNTER — Encounter: Payer: Self-pay | Admitting: Physical Therapy

## 2019-05-21 ENCOUNTER — Other Ambulatory Visit: Payer: Self-pay

## 2019-05-21 ENCOUNTER — Ambulatory Visit: Payer: 59 | Admitting: Physical Therapy

## 2019-05-21 DIAGNOSIS — M545 Low back pain, unspecified: Secondary | ICD-10-CM

## 2019-05-21 DIAGNOSIS — M25512 Pain in left shoulder: Secondary | ICD-10-CM

## 2019-05-21 DIAGNOSIS — M25612 Stiffness of left shoulder, not elsewhere classified: Secondary | ICD-10-CM

## 2019-05-21 DIAGNOSIS — M6281 Muscle weakness (generalized): Secondary | ICD-10-CM

## 2019-05-21 NOTE — Therapy (Deleted)
[facility] [address] [place], [place], [address] Phone: [phone]   Fax:  [phone]  Physical Therapy Treatment  Patient Details  Name: Jasmine SimsLinda Ann Mckenzie MRN: [id number] Date of Birth: Jan 29, 1981 Referring Provider (PT): Georgann HousekeeperHusain, Karrar, MD   Encounter Date: 05/21/2019  PT End of Session - 05/21/19 1307    Visit Number  5    Number of Visits  17    Date for PT Re-Evaluation  06/18/19    PT Start Time  0103    PT Stop Time  0200    PT Time Calculation (min)  57 min       Past Medical History:  Diagnosis Date  . Arthritis   . GERD (gastroesophageal reflux disease)   . Heart palpitations   . IBS (irritable bowel syndrome)     Past Surgical History:  Procedure Laterality Date  . BREAST SURGERY     augmentation  . CHOLECYSTECTOMY N/A 08/17/2016   Procedure: LAPAROSCOPIC CHOLECYSTECTOMY WITH INTRAOPERATIVE CHOLANGIOGRAM;  Surgeon: Manus RuddMatthew Tsuei, MD;  Location: [facility];  Service: General;  Laterality: N/A;  . UMBILICAL HERNIA REPAIR N/A 08/17/2016   Procedure: HERNIA REPAIR UMBILICAL ADULT;  Surgeon: Manus RuddMatthew Tsuei, MD;  Location: [facility] OR;  Service: General;  Laterality: N/A;    There were no vitals filed for this visit.  Subjective Assessment - 05/21/19 1305    Subjective  Sleeping on the floor is better for my back.    Currently in Pain?  Yes    Pain Score  6     Pain Location  Neck    Pain Score  7    Pain Location  Back                       [facility] Adult PT Treatment/Exercise - 05/21/19 0001      Lumbar Exercises: Stretches   Quadruped Mid Back Stretch Limitations  childs pose forward and laterals       Lumbar Exercises: Supine   Dead Bug Limitations  Beginning in table top    Bridge  20 reps;Compliant    Bridge Limitations  with initial pelvic tilit, segmental     Straight Leg Raise  15 reps    Straight Leg Raises Limitations  with abdominal draw in     Other Supine Lumbar Exercises   supine table top holds 10 sec x 3 , progressed to toe taps one at a time from table top.       Lumbar Exercises: Quadruped   Madcat/Old Horse  10 reps    Single Arm Raise  10 reps    Single Arm Raise Weights (lbs)  cues for chin tuck    Straight Leg Raise  10 reps    Straight Leg Raises Limitations  cues for abdominal draw in    Opposite Arm/Leg Raise  10 reps    Opposite Arm/Leg Raise Limitations  cues for abdominals and chin tuck       Knee/Hip Exercises: Stretches   Active Hamstring Stretch  3 reps;30 seconds    Other Knee/Hip Stretches  lower trunk rotation 2 x 10      Shoulder Exercises: Supine   Other Supine Exercises  chin tuck with alternating UE Flex             PT Education - 05/21/19 1328    Education Details  TENS purchase    Person(s) Educated  Patient    Methods  Explanation;Handout  Comprehension  Verbalized understanding       PT Short Term Goals - 04/23/19 1127      PT SHORT TERM GOAL #1   Title  pt to be I with inital HEP    Time  4    Period  Weeks    Status  New    Target Date  05/21/19      PT SHORT TERM GOAL #2   Title  pt to verbalize and demo proper posture and lifting mechanics to reduce and prevent back and shoulder pain    Time  4    Period  Weeks    Status  New    Target Date  05/21/19      PT SHORT TERM GOAL #3   Title  pt to increase L shoulder ROM by >/= 15 degrees in all planes to promote functional ROM with </= 5/10 pain    Time  4    Period  Weeks    Status  New    Target Date  05/21/19        PT Long Term Goals - 04/23/19 1129      PT LONG TERM GOAL #1   Title  increase L shoulder ROM to [facility] compared bil with </= 2/10 pain for functional mobility required for ADLs    Time  8    Period  Weeks    Status  New    Target Date  06/18/19      PT LONG TERM GOAL #2   Title  pt to increase trunk flexion/ extension to [facility] with no report of pain or spasm to promote proper posture and lifting mechanics    Time  6     Period  Weeks    Status  New    Target Date  06/04/19      PT LONG TERM GOAL #3   Title  pt to be able to lift and lower / push / pull >/= 20# for work related activites with no report of shoulder limitations/pain    Time  6    Period  Weeks    Status  New    Target Date  06/04/19      PT LONG TERM GOAL #4   Title  increase FOTO score to </= 33% limited to demo improvement in function    Time  8    Period  Weeks    Status  New    Target Date  06/18/19      PT LONG TERM GOAL #5   Title  pt to be I with all HEP given as of last visit to maintain and progress current level of function    Time  8    Period  Weeks    Status  New    Target Date  06/18/19            Plan - 05/21/19 1314    Clinical Impression Statement  Pt reports she was tested for COVID 19 as a precaution when she heard that a co- worker was out of work and being tested. SHe denies ever having symptoms of COVID 19 and the co-worker in question tested negative. Based on these events we were able to complete treatmentin clininc today despite not having her test results.  She reports feeling better since sleeping on the floor. She purchased a theracane for self TPR. Progressed core strengthening and neck stabilization with good tolerance. She was given info on purchase of home  tens unit.    PT Next Visit Plan  progress HEP for neck, low back and shoulder, manual work and modalities PRN (ionto or [place] to posterior shoulder if cert is signed)    PT Home Exercise Plan  lower trunk rotation, hamstring stretching, SLR, shoulder table slides (flexion/ abduction), Dead Bug    Consulted and Agree with Plan of Care  Patient       Patient will benefit from skilled therapeutic intervention in order to improve the following deficits and impairments:  Pain, Postural dysfunction, Improper body mechanics, Decreased strength, Decreased range of motion, Decreased activity tolerance, Decreased endurance, Increased muscle spasms  Visit  Diagnosis: 1. Acute left-sided low back pain without sciatica   2. Muscle weakness (generalized)   3. Stiffness of left shoulder, not elsewhere classified   4. Acute pain of left shoulder        Problem List Patient Active Problem List   Diagnosis Date Noted  . Chronic cholecystitis with calculus 08/17/2016    Dorene Ar, PTA 05/21/2019, 1:53 PM  [place], [place], [address] Phone: [phone]   Fax:  [phone]  Name: Jasmine Mckenzie MRN: [id number] Date of Birth: [birth year]

## 2019-05-21 NOTE — Patient Instructions (Addendum)

## 2019-05-21 NOTE — Therapy (Signed)
[facility] [address] [place], [place], [address] Phone: [phone]   Fax:  [phone]  Physical Therapy Treatment  Patient Details  Name: Jasmine SimsLinda Ann Mckenzie MRN: [id number] Date of Birth: 17-Nov-1980 Referring Provider (PT): Georgann HousekeeperHusain, Karrar, MD   Encounter Date: 05/21/2019  PT End of Session - 05/21/19 1307    Visit Number  5    Number of Visits  17    Date for PT Re-Evaluation  06/18/19    PT Start Time  0103    PT Stop Time  0200    PT Time Calculation (min)  57 min       Past Medical History:  Diagnosis Date  . Arthritis   . GERD (gastroesophageal reflux disease)   . Heart palpitations   . IBS (irritable bowel syndrome)     Past Surgical History:  Procedure Laterality Date  . BREAST SURGERY     augmentation  . CHOLECYSTECTOMY N/A 08/17/2016   Procedure: LAPAROSCOPIC CHOLECYSTECTOMY WITH INTRAOPERATIVE CHOLANGIOGRAM;  Surgeon: Manus RuddMatthew Tsuei, MD;  [facility];  Service: General;  Laterality: N/A;  . UMBILICAL HERNIA REPAIR N/A 08/17/2016   Procedure: HERNIA REPAIR UMBILICAL ADULT;  Surgeon: Manus RuddMatthew Tsuei, MD;  [facility];  Service: General;  Laterality: N/A;    There were no vitals filed for this visit.  Subjective Assessment - 05/21/19 1305    Subjective  Sleeping on the floor is better for my back.    Currently in Pain?  Yes    Pain Score  6     Pain Location  Neck    Pain Score  7    Pain Location  Back                       [facility] Adult PT Treatment/Exercise - 05/21/19 0001      Lumbar Exercises: Stretches   Quadruped Mid Back Stretch Limitations  childs pose forward and laterals       Lumbar Exercises: Supine   Dead Bug Limitations  Beginnin in table top    Bridge  20 reps;Compliant    Bridge Limitations  with initial pelvic tilit, segmental     Straight Leg Raise  15 reps    Straight Leg Raises Limitations  with abdominal draw in     Other Supine Lumbar Exercises   supine table top holds 10 sec x 3 , progressed to toe taps one at a time from table top.       Lumbar Exercises: Quadruped   Madcat/Old Horse  10 reps    Single Arm Raise  10 reps    Single Arm Raise Weights (lbs)  cues for chin tuck    Straight Leg Raise  10 reps    Straight Leg Raises Limitations  cues for abdominal draw in    Opposite Arm/Leg Raise  10 reps    Opposite Arm/Leg Raise Limitations  cues for abdominals and chin tuck       Knee/Hip Exercises: Stretches   Active Hamstring Stretch  3 reps;30 seconds    Other Knee/Hip Stretches  lower trunk rotation 2 x 10      Shoulder Exercises: Supine   Other Supine Exercises  chin tuck with alternating UE Flex      Moist Heat Therapy   Number Minutes Moist Heat  15 Minutes    Moist Heat Location  Cervical;Lumbar Spine      Electrical Stimulation   Electrical Stimulation Location  bil upper  trap, bil low back     Electrical Stimulation Action  pre-mod    Electrical Stimulation Parameters  15 min to tolerance     Electrical Stimulation Goals  Pain             PT Education - 05/21/19 1328    Education Details  TENS purchase    Person(s) Educated  Patient    Methods  Explanation;Handout    Comprehension  Verbalized understanding       PT Short Term Goals - 04/23/19 1127      PT SHORT TERM GOAL #1   Title  pt to be I with inital HEP    Time  4    Period  Weeks    Status  New    Target Date  05/21/19      PT SHORT TERM GOAL #2   Title  pt to verbalize and demo proper posture and lifting mechanics to reduce and prevent back and shoulder pain    Time  4    Period  Weeks    Status  New    Target Date  05/21/19      PT SHORT TERM GOAL #3   Title  pt to increase L shoulder ROM by >/= 15 degrees in all planes to promote functional ROM with </= 5/10 pain    Time  4    Period  Weeks    Status  New    Target Date  05/21/19        PT Long Term Goals - 04/23/19 1129      PT LONG TERM GOAL #1   Title  increase L  shoulder ROM to [facility] compared bil with </= 2/10 pain for functional mobility required for ADLs    Time  8    Period  Weeks    Status  New    Target Date  06/18/19      PT LONG TERM GOAL #2   Title  pt to increase trunk flexion/ extension to [facility] with no report of pain or spasm to promote proper posture and lifting mechanics    Time  6    Period  Weeks    Status  New    Target Date  06/04/19      PT LONG TERM GOAL #3   Title  pt to be able to lift and lower / push / pull >/= 20# for work related activites with no report of shoulder limitations/pain    Time  6    Period  Weeks    Status  New    Target Date  06/04/19      PT LONG TERM GOAL #4   Title  increase FOTO score to </= 33% limited to demo improvement in function    Time  8    Period  Weeks    Status  New    Target Date  06/18/19      PT LONG TERM GOAL #5   Title  pt to be I with all HEP given as of last visit to maintain and progress current level of function    Time  8    Period  Weeks    Status  New    Target Date  06/18/19            Plan - 05/21/19 1314    Clinical Impression Statement  Pt reports she was tested for COVID 19 as a precaution when she heard that a co- worker  was out of work and being tested. SHe denies ever having symptoms of COVID 19 and the co-worker in question tested negative. Based on these events we were able to complete treatmentin clininc today despite not having her test results.  She reports feeling better since sleeping on the floor. She purchased a theracane for self TPR. Progressed core strengthening and neck stabilization with good tolerance. She was given info on purchase of home tens unit.    PT Next Visit Plan  progress HEP for neck, low back and shoulder, manual work and modalities PRN (ionto or [place] to posterior shoulder if cert is signed)    PT Home Exercise Plan  lower trunk rotation, hamstring stretching, SLR, shoulder table slides (flexion/ abduction), Dead Bug    Consulted and  Agree with Plan of Care  Patient       Patient will benefit from skilled therapeutic intervention in order to improve the following deficits and impairments:  Pain, Postural dysfunction, Improper body mechanics, Decreased strength, Decreased range of motion, Decreased activity tolerance, Decreased endurance, Increased muscle spasms  Visit Diagnosis: 1. Acute left-sided low back pain without sciatica   2. Muscle weakness (generalized)   3. Stiffness of left shoulder, not elsewhere classified   4. Acute pain of left shoulder        Problem List Patient Active Problem List   Diagnosis Date Noted  . Chronic cholecystitis with calculus 08/17/2016    Dorene Ar, PTA 05/21/2019, 1:55 PM  [facility] [address] [place], [place], [address] Phone: [phone]   Fax:  [phone]  Name: Jasmine Mckenzie MRN: [id number] Date of Birth: 1981/03/29

## 2019-05-23 ENCOUNTER — Encounter: Payer: Self-pay | Admitting: Physical Therapy

## 2019-05-23 ENCOUNTER — Ambulatory Visit: Payer: 59 | Admitting: Physical Therapy

## 2019-05-23 ENCOUNTER — Other Ambulatory Visit: Payer: Self-pay

## 2019-05-23 DIAGNOSIS — M545 Low back pain, unspecified: Secondary | ICD-10-CM

## 2019-05-23 DIAGNOSIS — M25512 Pain in left shoulder: Secondary | ICD-10-CM

## 2019-05-23 DIAGNOSIS — M6281 Muscle weakness (generalized): Secondary | ICD-10-CM

## 2019-05-23 DIAGNOSIS — M25612 Stiffness of left shoulder, not elsewhere classified: Secondary | ICD-10-CM

## 2019-05-23 LAB — NOVEL CORONAVIRUS, NAA: SARS-CoV-2, NAA: NOT DETECTED

## 2019-05-23 NOTE — Therapy (Signed)
[place], [place], [address] Phone: [phone]   Fax:  [phone]  Physical Therapy Treatment  Patient Details  Name: Jasmine Mckenzie MRN: [id number] Date of Birth: 01-Apr-1981 Referring Provider (PT): Wenda Low, MD   Encounter Date: 05/23/2019  PT End of Session - 05/23/19 1230    Visit Number  6    Number of Visits  17    Date for PT Re-Evaluation  06/18/19    PT Start Time  1230    PT Stop Time  1319    PT Time Calculation (min)  49 min    Activity Tolerance  Patient tolerated treatment well       Past Medical History:  Diagnosis Date  . Arthritis   . GERD (gastroesophageal reflux disease)   . Heart palpitations   . IBS (irritable bowel syndrome)     Past Surgical History:  Procedure Laterality Date  . BREAST SURGERY     augmentation  . CHOLECYSTECTOMY N/A 08/17/2016   Procedure: LAPAROSCOPIC CHOLECYSTECTOMY WITH INTRAOPERATIVE CHOLANGIOGRAM;  Surgeon: Donnie Mesa, MD;  Location: [place];  Service: General;  Laterality: N/A;  . UMBILICAL HERNIA REPAIR N/A 08/17/2016   Procedure: HERNIA REPAIR UMBILICAL ADULT;  Surgeon: Donnie Mesa, MD;  Location: [place];  Service: General;  Laterality: N/A;    There were no vitals filed for this visit.  Subjective Assessment - 05/23/19 1232    Subjective  "My back is doing a little better most of the pain is on the L. My shoulder is doing much better"    Currently in Pain?  Yes    Pain Score  7     Pain Location  Back    Pain Orientation  Left    Pain Descriptors / Indicators  Aching    Pain Type  Chronic pain    Pain Onset  More than a month ago    Pain Frequency  Intermittent    Aggravating Factors   sleeping postiong                       [facility] Adult PT Treatment/Exercise - 05/23/19 0001      Lumbar Exercises: Stretches   Active Hamstring Stretch  4 reps;30 seconds   PNF contract/ relax   Passive Hamstring Stretch   4 reps;30 seconds;Left   2 x standing/ 2 x sitting   Lower Trunk Rotation Limitations  1  x 20    Quadruped Mid Back Stretch Limitations  childs pose forward and laterals    30 sec ea.     Knee/Hip Exercises: Supine   Straight Leg Raises  Strengthening;1 set;15 reps;Left   with sustained quad set     Moist Heat Therapy   Number Minutes Moist Heat  10 Minutes    Moist Heat Location  Lumbar Spine   in prone     Electrical Stimulation   Electrical Stimulation Location  low back    Electrical Stimulation Action  IFC    Electrical Stimulation Parameters  L9.5 x 10 min, 100% scan    Electrical Stimulation Goals  Pain      Manual Therapy   Joint Mobilization  L1-L5 grade III PA    Muscle Energy Technique  Resisted R hip flexor / L hip extensor 10 x 5 sec hold             PT Education - 05/23/19 1257    Education Details  updated HEP  for hamstring stretching and self MET    Person(s) Educated  Patient    Methods  Explanation    Comprehension  Verbalized understanding       PT Short Term Goals - 04/23/19 1127      PT SHORT TERM GOAL #1   Title  pt to be I with inital HEP    Time  4    Period  Weeks    Status  New    Target Date  05/21/19      PT SHORT TERM GOAL #2   Title  pt to verbalize and demo proper posture and lifting mechanics to reduce and prevent back and shoulder pain    Time  4    Period  Weeks    Status  New    Target Date  05/21/19      PT SHORT TERM GOAL #3   Title  pt to increase L shoulder ROM by >/= 15 degrees in all planes to promote functional ROM with </= 5/10 pain    Time  4    Period  Weeks    Status  New    Target Date  05/21/19        PT Long Term Goals - 04/23/19 1129      PT LONG TERM GOAL #1   Title  increase L shoulder ROM to [facility] compared bil with </= 2/10 pain for functional mobility required for ADLs    Time  8    Period  Weeks    Status  New    Target Date  06/18/19      PT LONG TERM GOAL #2   Title  pt to increase  trunk flexion/ extension to [facility] with no report of pain or spasm to promote proper posture and lifting mechanics    Time  6    Period  Weeks    Status  New    Target Date  06/04/19      PT LONG TERM GOAL #3   Title  pt to be able to lift and lower / push / pull >/= 20# for work related activites with no report of shoulder limitations/pain    Time  6    Period  Weeks    Status  New    Target Date  06/04/19      PT LONG TERM GOAL #4   Title  increase FOTO score to </= 33% limited to demo improvement in function    Time  8    Period  Weeks    Status  New    Target Date  06/18/19      PT LONG TERM GOAL #5   Title  pt to be I with all HEP given as of last visit to maintain and progress current level of function    Time  8    Period  Weeks    Status  New    Target Date  06/18/19            Plan - 05/23/19 1258    Clinical Impression Statement  pt reports no pain inthe shoulder and continued soreness in the back. focused on SIJ involvement on the R with L hamstring stretching and MET for the hip flexors. continued working core strength and back stretching. End of session pt exhibits increased trunk flexion and decreased tension, utilized E-stim to help calm down low back soreness.       Patient will benefit from skilled therapeutic intervention in  order to improve the following deficits and impairments:     Visit Diagnosis: 1. Acute left-sided low back pain without sciatica   2. Muscle weakness (generalized)   3. Stiffness of left shoulder, not elsewhere classified   4. Acute pain of left shoulder        Problem List Patient Active Problem List   Diagnosis Date Noted  . Chronic cholecystitis with calculus 08/17/2016   Starr Lake PT, DPT, LAT, ATC  05/23/19  1:10 PM      [facility] [address] [place], [place], [address] Phone: [phone]   Fax:  [phone]  Name: Jasmine Mckenzie MRN:  [id number] Date of Birth: 12-Aug-1981

## 2019-05-28 ENCOUNTER — Ambulatory Visit: Payer: 59 | Admitting: Physical Therapy

## 2019-05-30 ENCOUNTER — Ambulatory Visit: Payer: 59 | Admitting: Physical Therapy

## 2019-05-30 ENCOUNTER — Other Ambulatory Visit: Payer: Self-pay

## 2019-05-30 ENCOUNTER — Encounter: Payer: Self-pay | Admitting: Physical Therapy

## 2019-05-30 DIAGNOSIS — M545 Low back pain, unspecified: Secondary | ICD-10-CM

## 2019-05-30 DIAGNOSIS — M6281 Muscle weakness (generalized): Secondary | ICD-10-CM

## 2019-05-30 DIAGNOSIS — M25612 Stiffness of left shoulder, not elsewhere classified: Secondary | ICD-10-CM

## 2019-05-30 DIAGNOSIS — M25512 Pain in left shoulder: Secondary | ICD-10-CM

## 2019-05-30 NOTE — Therapy (Signed)
[place], [place], [address] Phone: [phone]   Fax:  [phone]  Physical Therapy Treatment / discharge Summary  Patient Details  Name: Jasmine Mckenzie MRN: [id number] Date of Birth: Jun 08, 1981 Referring Provider (PT): Wenda Low, MD   Encounter Date: 05/30/2019  PT End of Session - 05/30/19 1231    Visit Number  7    Number of Visits  17    Date for PT Re-Evaluation  06/18/19    PT Start Time  1505    PT Stop Time  1309    PT Time Calculation (min)  38 min    Activity Tolerance  Patient tolerated treatment well    Behavior During Therapy  [facility] for tasks assessed/performed       Past Medical History:  Diagnosis Date  . Arthritis   . GERD (gastroesophageal reflux disease)   . Heart palpitations   . IBS (irritable bowel syndrome)     Past Surgical History:  Procedure Laterality Date  . BREAST SURGERY     augmentation  . CHOLECYSTECTOMY N/A 08/17/2016   Procedure: LAPAROSCOPIC CHOLECYSTECTOMY WITH INTRAOPERATIVE CHOLANGIOGRAM;  Surgeon: Donnie Mesa, MD;  Location: [place];  Service: General;  Laterality: N/A;  . UMBILICAL HERNIA REPAIR N/A 08/17/2016   Procedure: HERNIA REPAIR UMBILICAL ADULT;  Surgeon: Donnie Mesa, MD;  Location: [place];  Service: General;  Laterality: N/A;    There were no vitals filed for this visit.  Subjective Assessment - 05/30/19 1232    Subjective  "My back is bothering me, I dont' know if I slept wrong. I feel good after the sessions but my pain always returns after returning to work. I think it may be time to return back to the doctor.    Currently in Pain?  Yes    Pain Location  Back    Pain Orientation  Left    Pain Descriptors / Indicators  Aching;Sore    Pain Type  Chronic pain    Pain Onset  More than a month ago    Pain Frequency  Constant    Aggravating Factors   sleeping position, ow         [facility] PT Assessment - 05/30/19 0001      Assessment   Medical Diagnosis  Neck, shoulder and back pain    Referring Provider (PT)  Wenda Low, MD    Onset Date/Surgical Date  04/17/19      Observation/Other Assessments   Focus on Therapeutic Outcomes (FOTO)   58% limited      AROM   Left Shoulder Flexion  148 Degrees    Left Shoulder ABduction  138 Degrees    Left Shoulder Internal Rotation  --   T8   Left Shoulder External Rotation  --   T4   Cervical Flexion  56    Cervical Extension  50    Cervical - Right Side Bend  44    Cervical - Left Side Bend  44    Cervical - Right Rotation  58    Cervical - Left Rotation  65    Lumbar Flexion  25    Lumbar Extension  28    Lumbar - Right Side Bend  10    Lumbar - Left Side Bend  10      Strength   Left Shoulder Flexion  4/5    Left Shoulder Extension  4/5    Left Shoulder ABduction  4/5    Left  Shoulder Internal Rotation  4/5    Left Shoulder External Rotation  4/5                   [facility] Adult PT Treatment/Exercise - 05/30/19 0001      Therapeutic Activites    Therapeutic Activities  Work Simulation    Work [employer] with proper form to protect the back from floor to waist 10#       Lumbar Exercises: Stretches   Passive Hamstring Stretch  3 reps;30 seconds   seated     Lumbar Exercises: Supine   Straight Leg Raise  10 reps      Manual Therapy   Muscle Energy Technique  Resisted R hip flexor / L hip extensor 10 x 5 sec hold   reviewed HEP            PT Education - 05/30/19 1309    Education Details  reviewed previously provided HEP. discussed proper lifting mechanics    Person(s) Educated  Patient    Methods  Explanation;Verbal cues;Handout   reprinted last HEP handout   Comprehension  Verbalized understanding;Verbal cues required       PT Short Term Goals - 05/30/19 1259      PT SHORT TERM GOAL #1   Title  pt to be I with inital HEP    Time  4    Period  Weeks    Status  Achieved      PT SHORT TERM GOAL  #2   Title  pt to verbalize and demo proper posture and lifting mechanics to reduce and prevent back and shoulder pain    Period  Weeks    Status  Achieved      PT SHORT TERM GOAL #3   Title  pt to increase L shoulder ROM by >/= 15 degrees in all planes to promote functional ROM with </= 5/10 pain    Period  Weeks    Status  Achieved        PT Long Term Goals - 05/30/19 1259      PT LONG TERM GOAL #1   Title  increase L shoulder ROM to [facility] compared bil with </= 2/10 pain for functional mobility required for ADLs    Time  8    Period  Weeks    Status  Achieved      PT LONG TERM GOAL #2   Title  pt to increase trunk flexion/ extension to [facility] with no report of pain or spasm to promote proper posture and lifting mechanics    Period  Weeks    Status  Partially Met      PT LONG TERM GOAL #3   Title  pt to be able to lift and lower / push / pull >/= 20# for work related activites with no report of shoulder limitations/pain    Period  Weeks    Status  Partially Met      PT LONG TERM GOAL #4   Title  increase FOTO score to </= 33% limited to demo improvement in function    Period  Weeks    Status  Not Met      PT LONG TERM GOAL #5   Title  pt to be I with all HEP given as of last visit to maintain and progress current level of function    Period  Weeks    Status  Partially Met  Plan - 05/30/19 1305    Clinical Impression Statement  pt reports neck and shoulder pain has resolved but reports continued low back pain that she gets intermittent relief following PT treatment but notes pain returns with work related activities. Reviewed previously provided HEP and discussed benefits of consistency to reduce pain, reviewed lifting mechanics which she reported being familiar with but not utilizing at work, she made some progress toward her LTGs.  per pt's request plan to discharge from phsical therapy today due to conitnued pain.    PT Next Visit Plan  d/C    PT Home  Exercise Plan  lower trunk rotation, hamstring stretching, SLR, shoulder table slides (flexion/ abduction), Dead Bug    Consulted and Agree with Plan of Care  Patient       Patient will benefit from skilled therapeutic intervention in order to improve the following deficits and impairments:     Visit Diagnosis: 1. Acute left-sided low back pain without sciatica   2. Muscle weakness (generalized)   3. Stiffness of left shoulder, not elsewhere classified   4. Acute pain of left shoulder        Problem List Patient Active Problem List   Diagnosis Date Noted  . Chronic cholecystitis with calculus 08/17/2016    [place] 05/30/2019, 1:11 PM  [facility] [address] [place], [place], [address] Phone: [phone]   Fax:  [phone]  Name: Heidi Maclin MRN: [id number] Date of Birth: 03-21-1981         PHYSICAL THERAPY DISCHARGE SUMMARY  Visits from Start of Care: 7  Current functional level related to goals / functional outcomes: See goals, FOTO 58% limited   Remaining deficits: Continued low back pain and limited trunk mobility. Difficulty with proper posture and lifting mechanics   Education / Equipment: HEP, theraband, posture, lifting mechanics  Plan: Patient agrees to discharge.  Patient goals were partially met. Patient is being discharged due to the patient's request.  ?????         Nayelli Inglis PT, DPT, LAT, ATC  05/30/19  1:12 PM

## 2020-03-14 ENCOUNTER — Other Ambulatory Visit (HOSPITAL_BASED_OUTPATIENT_CLINIC_OR_DEPARTMENT_OTHER): Payer: Self-pay

## 2020-03-14 DIAGNOSIS — G4752 REM sleep behavior disorder: Secondary | ICD-10-CM

## 2020-03-14 DIAGNOSIS — G4719 Other hypersomnia: Secondary | ICD-10-CM

## 2020-03-14 DIAGNOSIS — G478 Other sleep disorders: Secondary | ICD-10-CM

## 2020-04-07 ENCOUNTER — Encounter (HOSPITAL_COMMUNITY): Payer: Self-pay

## 2020-04-07 ENCOUNTER — Other Ambulatory Visit: Payer: Self-pay

## 2020-04-07 ENCOUNTER — Ambulatory Visit (INDEPENDENT_AMBULATORY_CARE_PROVIDER_SITE_OTHER): Payer: 59

## 2020-04-07 ENCOUNTER — Ambulatory Visit (HOSPITAL_COMMUNITY)
Admission: EM | Admit: 2020-04-07 | Discharge: 2020-04-07 | Disposition: A | Discharge status: Home / Self Care | Payer: 59 | Attending: Family Medicine | Admitting: Family Medicine

## 2020-04-07 DIAGNOSIS — M79671 Pain in right foot: Secondary | ICD-10-CM

## 2020-04-07 DIAGNOSIS — S93601A Unspecified sprain of right foot, initial encounter: Secondary | ICD-10-CM | POA: Diagnosis not present

## 2020-04-07 MED ORDER — HYDROCODONE-ACETAMINOPHEN 5-325 MG PO TABS: 1.0000 | ORAL_TABLET | Freq: Four times a day (QID) | ORAL | 0 refills | Status: DC | PRN

## 2020-04-07 NOTE — ED Provider Notes (Signed)
[facility]    CSN: [id number] Arrival date & time: 04/07/20  1611      History   Chief Complaint Chief Complaint  Patient presents with  . Foot Pain    HPI Jasmine Mckenzie is a 39 y.o. female.   HPI  Patient states she stepped in a hole in her swimming pool, it was the fact, and she somehow twisted her foot.  She has had surgery on this foot.  She has been unable to comfortably bear weight ever since then.  She hurts all across the midfoot, metatarsal region.  She states it was swollen yesterday.  Is not today.  She still cannot comfortably walk.  Past Medical History:  Diagnosis Date  . Arthritis   . GERD (gastroesophageal reflux disease)   . Heart palpitations   . IBS (irritable bowel syndrome)     Patient Active Problem List   Diagnosis Date Noted  . Chronic cholecystitis with calculus 08/17/2016    Past Surgical History:  Procedure Laterality Date  . BREAST SURGERY     augmentation  . CHOLECYSTECTOMY N/A 08/17/2016   Procedure: LAPAROSCOPIC CHOLECYSTECTOMY WITH INTRAOPERATIVE CHOLANGIOGRAM;  Surgeon: Manus Rudd, MD;  Location[facility];  Service: General;  Laterality: N/A;  . UMBILICAL HERNIA REPAIR N/A 08/17/2016   Procedure: HERNIA REPAIR UMBILICAL ADULT;  Surgeon: Manus Rudd, MD;  [facility];  Service: General;  Laterality: N/A;    OB History    Gravida  5   Para  4   Term  4   Preterm      AB  1   Living  4     SAB      TAB      Ectopic  1   Multiple      Live Births           Obstetric Comments  Ectopic in 2019 tx with MTX         Home Medications    Prior to Admission medications   Medication Sig Start Date End Date Taking? Authorizing Provider  gabapentin (NEURONTIN) 100 MG capsule Take 100 mg by mouth 3 (three) times daily.    [provider]  HYDROcodone-acetaminophen (NORCO/VICODIN) 5-325 MG tablet Take 1-2 tablets by mouth every 6 (six) hours as needed for moderate pain or severe pain.  04/07/20   Eustace Moore, MD  ibuprofen (ADVIL) 600 MG tablet Take 1 tablet (600 mg total) by mouth every 6 (six) hours as needed. 04/17/19   Domenick Gong, MD  tiZANidine (ZANAFLEX) 4 MG tablet Take 1 tablet (4 mg total) by mouth every 8 (eight) hours as needed for muscle spasms. 04/17/19   Domenick Gong, MD    Family History Family History  Problem Relation Age of Onset  . Hypertension Mother   . Diabetes Father   . Hypertension Father     Social History Social History   Tobacco Use  . Smoking status: Never Smoker  . Smokeless tobacco: Never Used  Substance Use Topics  . Alcohol use: Yes    Comment: occasional wine  . Drug use: No     Allergies   Amoxicillin and No known allergies   Review of Systems Review of Systems  Musculoskeletal: Positive for arthralgias and gait problem.     Physical Exam Triage Vital Signs ED Triage Vitals  Enc Vitals Group     BP 04/07/20 1656 128/79     Pulse Rate 04/07/20 1656 71     Resp  04/07/20 1656 18     Temp 04/07/20 1656 98 F (36.7 C)     Temp Source 04/07/20 1656 Oral     SpO2 04/07/20 1656 100 %     Weight --      Height --      Head Circumference --      Peak Flow --      Pain Score 04/07/20 1654 6     Pain Loc --      Pain Edu? --      Excl. in [place]? --    No data found.  Updated Vital Signs BP 128/79 (BP Location: Right Arm)   Pulse 71   Temp 98 F (36.7 C) (Oral)   Resp 18   LMP 04/04/2020   SpO2 100%      Physical Exam Constitutional:      General: She is not in acute distress.    Appearance: She is well-developed and normal weight.  HENT:     Head: Normocephalic and atraumatic.     Mouth/Throat:     Comments: Mask is in place Eyes:     Conjunctiva/sclera: Conjunctivae normal.     Pupils: Pupils are equal, round, and reactive to light.  Cardiovascular:     Rate and Rhythm: Normal rate.  Pulmonary:     Effort: Pulmonary effort is normal. No respiratory distress.  Abdominal:      General: There is no distension.     Palpations: Abdomen is soft.  Musculoskeletal:        General: Normal range of motion.     Cervical back: Normal range of motion.     Comments: The right foot has no soft tissue swelling.  No bruising.  There is tenderness over the first metatarsal mid to proximal.  Skin:    General: Skin is warm and dry.  Neurological:     General: No focal deficit present.     Mental Status: She is alert.     Gait: Gait abnormal.  Psychiatric:        Mood and Affect: Mood normal.      UC Treatments / Results  Labs (all labs ordered are listed, but only abnormal results are displayed) Labs Reviewed - No data to display  EKG   Radiology DG Foot Complete Right  Result Date: 04/07/2020 CLINICAL DATA:  39 year old female with persistent pain 3 days after stepping into a hole. EXAM: RIGHT FOOT COMPLETE - 3+ VIEW COMPARISON:  None. FINDINGS: Chronic postoperative changes to the 1st metatarsal with metal K-wire in place. Mild pes planus. Bone mineralization is within normal limits. No acute osseous abnormality identified. Joint spaces and alignment within normal limits. No discrete soft tissue injury. IMPRESSION: No acute fracture or dislocation identified about the right foot. Electronically Signed   By: Genevie Ann M.D.   On: 04/07/2020 18:06    Procedures Procedures (including critical care time)  Medications Ordered in UC Medications - No data to display  Initial Impression / Assessment and Plan / UC Course  I have reviewed the triage vital signs and the nursing notes.  Pertinent labs & imaging results that were available during my care of the patient were reviewed by me and considered in my medical decision making (see chart for details).     *Reviewed normal x-rays.  Discussed conservative management.  Return as needed Final Clinical Impressions(s) / UC Diagnoses   Final diagnoses:  Sprain of right foot, initial encounter     Discharge Instructions  Limit walking Ice and elevation Pain medicine as needed Call your PCP if not improving    ED Prescriptions    Medication Sig Dispense Auth. Provider   HYDROcodone-acetaminophen (NORCO/VICODIN) 5-325 MG tablet Take 1-2 tablets by mouth every 6 (six) hours as needed for moderate pain or severe pain. 12 tablet Eustace Moore, MD     I have reviewed the PDMP during this encounter.   Eustace Moore, MD 04/07/20 [place]

## 2020-04-07 NOTE — ED Triage Notes (Signed)
Pt presents with right foot injury from stepping into a vent in her pool on Friday.

## 2020-04-07 NOTE — Discharge Instructions (Signed)
Limit walking Ice and elevation Pain medicine as needed Call your PCP if not improving

## 2020-04-12 ENCOUNTER — Other Ambulatory Visit (HOSPITAL_COMMUNITY)
Admission: RE | Admit: 2020-04-12 | Discharge: 2020-04-12 | Disposition: A | Discharge status: Home / Self Care | Payer: 59 | Source: Ambulatory Visit | Attending: Internal Medicine | Admitting: Internal Medicine

## 2020-04-12 DIAGNOSIS — Z20822 Contact with and (suspected) exposure to covid-19: Secondary | ICD-10-CM | POA: Diagnosis not present

## 2020-04-12 DIAGNOSIS — Z01812 Encounter for preprocedural laboratory examination: Secondary | ICD-10-CM | POA: Diagnosis present

## 2020-04-12 LAB — SARS CORONAVIRUS 2 (TAT 6-24 HRS): SARS Coronavirus 2: NEGATIVE

## 2020-04-15 ENCOUNTER — Ambulatory Visit (HOSPITAL_BASED_OUTPATIENT_CLINIC_OR_DEPARTMENT_OTHER): Payer: 59 | Attending: Internal Medicine | Admitting: Internal Medicine

## 2020-04-15 ENCOUNTER — Other Ambulatory Visit: Payer: Self-pay

## 2020-04-15 DIAGNOSIS — G4752 REM sleep behavior disorder: Secondary | ICD-10-CM | POA: Insufficient documentation

## 2020-04-15 DIAGNOSIS — G4719 Other hypersomnia: Secondary | ICD-10-CM | POA: Diagnosis not present

## 2020-04-15 DIAGNOSIS — G478 Other sleep disorders: Secondary | ICD-10-CM | POA: Insufficient documentation

## 2020-04-15 DIAGNOSIS — R0683 Snoring: Secondary | ICD-10-CM | POA: Diagnosis present

## 2020-04-16 ENCOUNTER — Other Ambulatory Visit (HOSPITAL_BASED_OUTPATIENT_CLINIC_OR_DEPARTMENT_OTHER): Payer: Self-pay

## 2020-04-16 ENCOUNTER — Ambulatory Visit (HOSPITAL_BASED_OUTPATIENT_CLINIC_OR_DEPARTMENT_OTHER): Payer: 59 | Attending: Internal Medicine | Admitting: Internal Medicine

## 2020-04-16 VITALS — Ht 62.0 in | Wt 155.0 lb

## 2020-04-16 DIAGNOSIS — G4711 Idiopathic hypersomnia with long sleep time: Secondary | ICD-10-CM

## 2020-04-16 DIAGNOSIS — G4752 REM sleep behavior disorder: Secondary | ICD-10-CM

## 2020-04-16 DIAGNOSIS — G478 Other sleep disorders: Secondary | ICD-10-CM

## 2020-04-16 DIAGNOSIS — G4719 Other hypersomnia: Secondary | ICD-10-CM

## 2020-04-21 NOTE — Procedures (Signed)
   NAME: Jasmine Mckenzie DATE OF BIRTH:  12/28/1980 MEDICAL RECORD NUMBER [id number]  LOCATION: [facility]  PHYSICIAN: Deretha Emory  DATE OF STUDY: 04/15/2020  SLEEP STUDY TYPE: Nocturnal Polysomnogram               REFERRING PHYSICIAN: Deretha Emory, MD  INDICATION FOR STUDY: Excessive daytime sleepiness with negative HSAT. Sleep paralysis, possible dream enactment. Test done prior to planned MSLT  EPWORTH SLEEPINESS SCORE:  8 HEIGHT: 5\' 2"  (157.5 cm)  WEIGHT: 155 lb (70.3 kg)    Body mass index is 28.35 kg/m.  NECK SIZE: 16 in.  MEDICATIONS Patient self administered medications include: N/A. Medications administered during study include No sleep medicine administered.  SLEEP STUDY TECHNIQUE A multi-channel overnight Polysomnography study was performed. The channels recorded and monitored were central and occipital EEG, electrooculogram (EOG), submentalis EMG (chin), nasal and oral airflow, thoracic and abdominal wall motion, anterior tibialis EMG, snore microphone, electrocardiogram, and a pulse oximetry.  TECHNICAL COMMENTS Comments added by Technician: None Comments added by Scorer: N/A  SLEEP ARCHITECTURE The study was initiated at 10:19:00 PM and terminated at 6:03:57 AM. The total recorded time was 464.9 minutes. EEG confirmed total sleep time was 314 minutes yielding a sleep efficiency of 67.5%%. Sleep onset after lights out was 62.3 minutes with a REM latency of 46.0 minutes. The patient spent 6.5%% of the night in stage N1 sleep, 68.5%% in stage N2 sleep, 10.5%% in stage N3 and 14.5% in REM. Wake after sleep onset (WASO) was 88.7 minutes. The Arousal Index was 2.5/hour.  RESPIRATORY PARAMETERS There were a total of 4 respiratory disturbances out of which 4 were apneas ( 0 obstructive, 0 mixed, 4 central) and 0 hypopneas. The apnea/hypopnea index (AHI) was 0.8 events/hour. The central sleep apnea index was 0.8 events/hour. The REM AHI was 4.0  events/hour and NREM AHI was 0.2 events/hour. The supine AHI was 1.3 events/hour and the non supine AHI was 0.7 events/hour. Respiratory disturbances were associated with oxygen desaturation down to a nadir of 95.0% during sleep. The mean oxygen saturation during the study was 98.1%. The cumulative time under 88% oxygen saturation was 0.0 minutes.  LEG MOVEMENT DATA The total leg movements were 0 with a resulting leg movement index of 0.0/hr . Associated arousal with leg movement index was 0.0/hr.  CARDIAC DATA The underlying cardiac rhythm was most consistent with sinus rhythm. Mean heart rate during sleep was 60.7 bpm. Additional rhythm abnormalities include None.  IMPRESSIONS - No Significant Obstructive Sleep apnea(OSA) - No significant periodic leg movements(PLMs) during sleep.   DIAGNOSIS - Excessive daytime sleepiness  RECOMMENDATIONS - May proceed with MSLT  Marland Kitchen Sleep specialist, American Board of Internal Medicine  ELECTRONICALLY SIGNED ON:  04/21/2020, 8:34 PM [facility] PH: [phone]   FX: [phone] ACCREDITED BY THE AMERICAN ACADEMY OF SLEEP MEDICINE

## 2020-04-21 NOTE — Procedures (Signed)
    NAME: Jasmine Mckenzie DATE OF BIRTH:  25-Sep-1981 MEDICAL RECORD NUMBER [id number]  LOCATION: [facility]  PHYSICIAN: Deretha Emory  DATE OF STUDY: 04/16/2020  SLEEP STUDY TYPE: Multiple Sleep Latency Test               REFERRING PHYSICIAN: Deretha Emory, MD  INDICATION FOR STUDY: excessive daytime sleepiness with normal PSG  EPWORTH SLEEPINESS SCORE:  8 HEIGHT:    WEIGHT:      There is no height or weight on file to calculate BMI.  NECK SIZE:   in.  MEDICATIONS Patient self administered medications include: N/A. Medications administered during study include No sleep medicine administered.Marland Kitchen  SLEEP STUDY TECHNIQUE A multiple sleep latency test was performed. The channels recorded and monitored were central and occipital EEG, electrooculogram (EOG), submentalis EMG (chin), and electrocardiogram.  TECHNICAL COMMENTS Comments added by Technician: None Comments added by Scorer: N/A  IMPRESSIONS - One sleep onset REMs present. REM onset in PSG was at 46 minutes, which is slightly early. Nevertheless, this study is not diagnostic of narcolepsy - Total number of naps attempted: 5 . Total number of naps with sleep attained: 4 - Pathologic sleepiness is suggested by short mean sleep latency: 5:29 minutes. Excluding nap without sleep, SOL was less than 2 minutes  DIAGNOSIS - Idiopathic hypersomnia (327.11 [G47.11 ICD-10])  RECOMMENDATIONS - Return for follow up and management of Idiopathic Hypersomnia.   Deretha Emory, MD Sleep specialist, American Board of Internal Medicine  ELECTRONICALLY SIGNED ON:  04/21/2020, 8:42 PM [facility] PH: [phone]   FX: [phone] ACCREDITED BY THE AMERICAN ACADEMY OF SLEEP MEDICINE

## 2020-11-25 ENCOUNTER — Encounter: Payer: Self-pay | Admitting: *Deleted

## 2020-11-27 ENCOUNTER — Encounter: Payer: Self-pay | Admitting: Neurology

## 2020-11-27 ENCOUNTER — Ambulatory Visit (INDEPENDENT_AMBULATORY_CARE_PROVIDER_SITE_OTHER): Payer: 59 | Admitting: Neurology

## 2020-11-27 VITALS — BP 134/90 | HR 71 | Ht 62.0 in | Wt 151.5 lb

## 2020-11-27 DIAGNOSIS — M79672 Pain in left foot: Secondary | ICD-10-CM

## 2020-11-27 DIAGNOSIS — M79671 Pain in right foot: Secondary | ICD-10-CM | POA: Diagnosis not present

## 2020-11-27 MED ORDER — DULOXETINE HCL 60 MG PO CPEP: 60.0000 mg | ORAL_CAPSULE | Freq: Every day | ORAL | 12 refills | Status: DC

## 2020-11-27 NOTE — Progress Notes (Signed)
Chief Complaint  Patient presents with  . New Patient (Initial Visit)    Pain in bilateral feet. When sitting for prolonged periods of time, she has tingling as if her feet are just waking up. She also has discomfort in her upper and mid-back that is painful to touch. She tried chiropractor care with minimal relief. He told her she had a pinched nerve and had poor posture. She has previously had epidural steroid injections but she stopped at the advice of her PCP (too much steroid). Her last injection was in 2019 [facility]). MRI and PT at [facility].    HISTORICAL  Jasmine Mckenzie is a 40 year old female, seen in request by her podiatrist Dr. Elvin So, Jonny Ruiz for evaluation of bilateral foot pain,  I reviewed and summarized the referring note.PMHx. Idiopathic hypersomnolence disorder, please treated by sleep physician in [place], 10 mg twice a day since Nov 2021.  Chronic insomnia, taking melatonin 10 mg at bedtime as needed, Sonata  She complains long history of chronic low back, upper back, cervical, multiple hand and foot joints pain, become more noticeable since her left bunionectomy surgery in 2018.  She had her first right bunionectomy in 2017, recovered fine, had left bunionectomy in 2018,  Following that, she began to notice left foot pain, involving left first toe, extending to the dorsum left foot, few months later, similar involvement of right foot, now she complains of heel pain, plantar area pain, especially after bearing weight  Reported abnormal skin biopsy showed significant nerve damage but I do not have the formal report  Over the years, she also complains of worsening low back, upper back pain, has tried gabapentin up to 300 mg 3 times daily without significant benefit, stopped taking it since January 2022, received a chiropractor, physical therapy, with no significant improvement  She was also seen by orthopedic in the past, no significant abnormal MRI of lumbar  spine, did have some improvement with epidural injection,  She worked at the post office third shift, 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, is under the sleep specialist care at [place], taking greater than 10 mg twice a day at 8 PM, 3 AM, meeting Sonata, or melatonin to falling to sleep, even that, she described difficulty falling to sleep, poor sleep quality, extreme fatigue,    Laboratory evaluations in May 2020, anemia hemoglobin of 10.8, normal CMP with exception of mildly decreased calcium 8.5, potassium of 3.1,  Emergency room presentation in June 2021, right foot x-ray showed no acute abnormality, chronic postoperative change to the first metatarsal with metal K wire in place, mild pes planus,  REVIEW OF SYSTEMS: Full 14 system review of systems performed and notable only for as above All other review of systems were negative.  ALLERGIES: Allergies  Allergen Reactions  . Amoxicillin     HOME MEDICATIONS: Current Outpatient Medications  Medication Sig Dispense Refill  . gabapentin (NEURONTIN) 300 MG capsule Take 300 mg by mouth 3 (three) times daily.    . methylphenidate (METADATE CD) 20 MG CR capsule Take 1 capsule by mouth daily.     No current facility-administered medications for this visit.    PAST MEDICAL HISTORY: Past Medical History:  Diagnosis Date  . Arthritis   . Back pain   . Diabetes (HCC)   . GERD (gastroesophageal reflux disease)   . Heart palpitations   . IBS (irritable bowel syndrome)   . Neuropathy     PAST SURGICAL HISTORY: Past Surgical History:  Procedure  Laterality Date  . BREAST SURGERY     augmentation  . CHOLECYSTECTOMY N/A 08/17/2016   Procedure: LAPAROSCOPIC CHOLECYSTECTOMY WITH INTRAOPERATIVE CHOLANGIOGRAM;  Surgeon: Manus Rudd, MD;  Location: [facility];  Service: General;  Laterality: N/A;  . UMBILICAL HERNIA REPAIR N/A 08/17/2016   Procedure: HERNIA REPAIR UMBILICAL ADULT;  Surgeon: Manus Rudd, MD;  Location: [facility] OR;  Service: General;  Laterality:  N/A;    FAMILY HISTORY: Family History  Problem Relation Age of Onset  . Hypertension Mother   . Diabetes Father   . Hypertension Father     SOCIAL HISTORY: Social History   Socioeconomic History  . Marital status: Married    Spouse name: Not on file  . Number of children: Not on file  . Years of education: Not on file  . Highest education level: Not on file  Occupational History  . Not on file  Tobacco Use  . Smoking status: Never Smoker  . Smokeless tobacco: Never Used  Substance and Sexual Activity  . Alcohol use: Yes    Comment: occasional wine  . Drug use: Never  . Sexual activity: Yes    Birth control/protection: None  Other Topics Concern  . Not on file  Social History Narrative  . Not on file   Social Determinants of Health   Financial Resource Strain: Not on file  Food Insecurity: Not on file  Transportation Needs: Not on file  Physical Activity: Not on file  Stress: Not on file  Social Connections: Not on file  Intimate Partner Violence: Not on file     PHYSICAL EXAM   Vitals:   11/27/20 0922  Weight: 151 lb 8 oz (68.7 kg)  Height: 5\' 2"  (1.575 m)   Not recorded     Body mass index is 27.71 kg/m.  PHYSICAL EXAMNIATION:  Gen: NAD, conversant, well nourised, well groomed                     Cardiovascular: Regular rate rhythm, no peripheral edema, warm, nontender. Eyes: Conjunctivae clear without exudates or hemorrhage Neck: Supple, no carotid bruits. Pulmonary: Clear to auscultation bilaterally   NEUROLOGICAL EXAM:  MENTAL STATUS: Speech:    Speech is normal; fluent and spontaneous with normal comprehension.  Cognition:     Orientation to time, place and person     Normal recent and remote memory     Normal Attention span and concentration     Normal Language, naming, repeating,spontaneous speech     Fund of knowledge   CRANIAL NERVES: CN II: Visual fields are full to confrontation. Pupils are round equal and briskly reactive  to light. CN III, IV, VI: extraocular movement are normal. No ptosis. CN V: Facial sensation is intact to light touch CN VII: Face is symmetric with normal eye closure  CN VIII: Hearing is normal to causal conversation. CN IX, X: Phonation is normal. CN XI: Head turning and shoulder shrug are intact  MOTOR: There is no pronator drift of out-stretched arms. Muscle bulk and tone are normal. Muscle strength is normal.  REFLEXES: Reflexes are 2+ and symmetric at the biceps, triceps, knees, and ankles. Plantar responses are flexor.  SENSORY: Intact to light touch, pinprick and vibratory sensation are intact in fingers and toes.  COORDINATION: There is no trunk or limb dysmetria noted.  GAIT/STANCE: She can get up from seated position, arms crossed, bilateral flatfeet, steady gait Romberg is absent.   DIAGNOSTIC DATA (LABS, IMAGING, TESTING) - I reviewed patient records,  labs, notes, testing and imaging myself where available.   ASSESSMENT AND PLAN  Jasmine Mckenzie is a 40 y.o. female   Long history of diffuse body achy pain, insomnia, worsening 5 years history of bilateral feet pain, pelvic pain,  Reported abnormal skin biopsy showed evidence of nerve damage, done by podiatrist,  Essentially normal neurological examinations, also reported no significant MRI of lumbar spine by orthopedic surgeon in the past,  Laboratory evaluations include inflammatory markers to rule out systemic disease  Cymbalta 60 mg for symptomatic treatment  Also suggested warm compression, back and muscle stretching exercise  EMG nerve conduction study  Levert Feinstein, M.D. Ph.D.  [facility] [address] [place], [place] [address] Ph: [phone] Fax: [phone]  CC:  Merwyn Katos, [place] [address] [place],  [place] [address]  Pa, [facility]

## 2020-12-01 ENCOUNTER — Other Ambulatory Visit: Payer: Self-pay

## 2020-12-01 ENCOUNTER — Emergency Department (HOSPITAL_COMMUNITY)
Admission: EM | Admit: 2020-12-01 | Discharge: 2020-12-01 | Disposition: A | Discharge status: Home / Self Care | Payer: 59 | Attending: Emergency Medicine | Admitting: Emergency Medicine

## 2020-12-01 ENCOUNTER — Telehealth: Payer: Self-pay | Admitting: Neurology

## 2020-12-01 DIAGNOSIS — T43205A Adverse effect of unspecified antidepressants, initial encounter: Secondary | ICD-10-CM | POA: Diagnosis not present

## 2020-12-01 DIAGNOSIS — R0989 Other specified symptoms and signs involving the circulatory and respiratory systems: Secondary | ICD-10-CM | POA: Insufficient documentation

## 2020-12-01 DIAGNOSIS — E1140 Type 2 diabetes mellitus with diabetic neuropathy, unspecified: Secondary | ICD-10-CM | POA: Insufficient documentation

## 2020-12-01 DIAGNOSIS — R531 Weakness: Secondary | ICD-10-CM | POA: Insufficient documentation

## 2020-12-01 DIAGNOSIS — R42 Dizziness and giddiness: Secondary | ICD-10-CM | POA: Diagnosis not present

## 2020-12-01 DIAGNOSIS — R110 Nausea: Secondary | ICD-10-CM | POA: Insufficient documentation

## 2020-12-01 DIAGNOSIS — T50905A Adverse effect of unspecified drugs, medicaments and biological substances, initial encounter: Secondary | ICD-10-CM

## 2020-12-01 LAB — CBC WITH DIFFERENTIAL/PLATELET
Basophils Absolute: 0 10*3/uL (ref 0.0–0.2)
Basos: 0 %
EOS (ABSOLUTE): 0 10*3/uL (ref 0.0–0.4)
Eos: 1 %
Hematocrit: 36.7 % (ref 34.0–46.6)
Hemoglobin: 12.4 g/dL (ref 11.1–15.9)
Immature Grans (Abs): 0 10*3/uL (ref 0.0–0.1)
Immature Granulocytes: 0 %
Lymphocytes Absolute: 3.3 10*3/uL — ABNORMAL HIGH (ref 0.7–3.1)
Lymphs: 46 %
MCH: 30.5 pg (ref 26.6–33.0)
MCHC: 33.8 g/dL (ref 31.5–35.7)
MCV: 90 fL (ref 79–97)
Monocytes Absolute: 0.5 10*3/uL (ref 0.1–0.9)
Monocytes: 6 %
Neutrophils Absolute: 3.3 10*3/uL (ref 1.4–7.0)
Neutrophils: 47 %
Platelets: 299 10*3/uL (ref 150–450)
RBC: 4.07 x10E6/uL (ref 3.77–5.28)
RDW: 12.8 % (ref 11.7–15.4)
WBC: 7.1 10*3/uL (ref 3.4–10.8)

## 2020-12-01 LAB — C-REACTIVE PROTEIN: CRP: <1 mg/L (ref 0–10)

## 2020-12-01 LAB — MULTIPLE MYELOMA PANEL, SERUM
Albumin SerPl Elph-Mcnc: 4.3 g/dL (ref 2.9–4.4)
Albumin/Glob SerPl: 1.3 (ref 0.7–1.7)
Alpha 1: 0.2 g/dL (ref 0.0–0.4)
Alpha2 Glob SerPl Elph-Mcnc: 0.6 g/dL (ref 0.4–1.0)
B-Globulin SerPl Elph-Mcnc: 1 g/dL (ref 0.7–1.3)
Gamma Glob SerPl Elph-Mcnc: 1.6 g/dL (ref 0.4–1.8)
Globulin, Total: 3.4 g/dL (ref 2.2–3.9)
IgA/Immunoglobulin A, Serum: 114 mg/dL (ref 87–352)
IgG (Immunoglobin G), Serum: 1527 mg/dL (ref 586–1602)
IgM (Immunoglobulin M), Srm: 145 mg/dL (ref 26–217)
Total Protein: 7.7 g/dL (ref 6.0–8.5)

## 2020-12-01 LAB — SEDIMENTATION RATE: Sed Rate: 3 mm/hr (ref 0–32)

## 2020-12-01 LAB — VITAMIN D 25 HYDROXY (VIT D DEFICIENCY, FRACTURES): Vit D, 25-Hydroxy: 39.5 ng/mL (ref 30.0–100.0)

## 2020-12-01 LAB — ANA W/REFLEX IF POSITIVE: Anti Nuclear Antibody (ANA): NEGATIVE

## 2020-12-01 LAB — CK: Total CK: 196 U/L — ABNORMAL HIGH (ref 32–182)

## 2020-12-01 LAB — RPR: RPR Ser Ql: NONREACTIVE

## 2020-12-01 LAB — VITAMIN B12: Vitamin B-12: 341 pg/mL (ref 232–1245)

## 2020-12-01 MED ORDER — ONDANSETRON HCL 4 MG PO TABS
4.0000 mg | ORAL_TABLET | Freq: Once | ORAL | Status: AC
  Administered 2020-12-01: 4 mg via ORAL
  Filled 2020-12-01: qty 1

## 2020-12-01 MED ORDER — ACETAMINOPHEN 500 MG PO TABS
1000.0000 mg | ORAL_TABLET | Freq: Once | ORAL | Status: AC
  Administered 2020-12-01: 1000 mg via ORAL
  Filled 2020-12-01: qty 2

## 2020-12-01 NOTE — Telephone Encounter (Signed)
Pt. states that DULoxetine (CYMBALTA) 60 MG capsule has her feeling weird & not good. Please advise.

## 2020-12-01 NOTE — Discharge Instructions (Addendum)
Please return for any problem.   Discuss with your regular care provider whether you should continue to use Cymbalta in the future.

## 2020-12-01 NOTE — ED Provider Notes (Signed)
[facility] EMERGENCY DEPARTMENT Provider Note   CSN: [id number] Arrival date & time: 12/01/20  1437     History No chief complaint on file.   Jasmine Mckenzie is a 40 y.o. female.  40 year old female with prior medical history as detailed below presents for evaluation.  Patient reports that she took her first dose of Cymbalta this morning around 10 AM.  Shortly after taking this she began to have symptoms.  She describes feeling dizzy, weak, nausea, her throat felt tight, and she felt "weird."  Patient reports that after rolling the ED wait time she now feels improved.  The history is provided by the patient and medical records.  Illness Location:  Medication reaction  Severity:  Mild Onset quality:  Sudden Duration:  2 hours Timing:  Rare Progression:  Resolved Chronicity:  New      Past Medical History:  Diagnosis Date  . Arthritis   . Back pain   . Diabetes (HCC)   . GERD (gastroesophageal reflux disease)   . Heart palpitations   . IBS (irritable bowel syndrome)   . Idiopathic hypersomnia   . Neuropathy     Patient Active Problem List   Diagnosis Date Noted  . Pain in both feet 11/27/2020  . Chronic cholecystitis with calculus 08/17/2016    Past Surgical History:  Procedure Laterality Date  . BREAST SURGERY     augmentation  . CHOLECYSTECTOMY N/A 08/17/2016   Procedure: LAPAROSCOPIC CHOLECYSTECTOMY WITH INTRAOPERATIVE CHOLANGIOGRAM;  Surgeon: Manus Rudd, MD;  Location: [facility] OR;  Service: General;  Laterality: N/A;  . UMBILICAL HERNIA REPAIR N/A 08/17/2016   Procedure: HERNIA REPAIR UMBILICAL ADULT;  Surgeon: Manus Rudd, MD;  Location: [facility];  Service: General;  Laterality: N/A;     OB History    Gravida  5   Para  4   Term  4   Preterm      AB  1   Living  4     SAB      IAB      Ectopic  1   Multiple      Live Births           Obstetric Comments  Ectopic in 2019 tx with MTX        Family History   Problem Relation Age of Onset  . Hypertension Mother   . Diabetes Father   . Hypertension Father     Social History   Tobacco Use  . Smoking status: Never Smoker  . Smokeless tobacco: Never Used  Substance Use Topics  . Alcohol use: Yes    Comment: occasional wine  . Drug use: Never    Home Medications Prior to Admission medications   Medication Sig Start Date End Date Taking? Authorizing Provider  acetaminophen (TYLENOL) 325 MG tablet Take 650 mg by mouth as needed.    [provider]  celecoxib (CELEBREX) 200 MG capsule Take 200 mg by mouth daily. 08/19/20   [provider]  MELATONIN GUMMIES PO Take 10 mg by mouth at bedtime as needed.    [provider]  methylphenidate (RITALIN) 10 MG tablet Take 10 mg by mouth 2 (two) times daily.    [provider]  zaleplon (SONATA) 5 MG capsule Take 5 mg by mouth at bedtime as needed. 06/30/20   [provider]    Allergies    Duloxetine and Amoxicillin  Review of Systems   Review of Systems  All other systems reviewed  and are negative.   Physical Exam Updated Vital Signs BP (!) 139/91   Pulse 69   Temp 98.3 F (36.8 C) (Oral)   Resp 17   Ht 5\' 2"  (1.575 m)   Wt 68 kg   SpO2 100%   BMI 27.44 kg/m   Physical Exam Vitals and nursing note reviewed.  Constitutional:      General: She is not in acute distress.    Appearance: Normal appearance. She is well-developed and well-nourished.  HENT:     Head: Normocephalic and atraumatic.     Mouth/Throat:     Mouth: Oropharynx is clear and moist.  Eyes:     Extraocular Movements: EOM normal.     Conjunctiva/sclera: Conjunctivae normal.     Pupils: Pupils are equal, round, and reactive to light.  Cardiovascular:     Rate and Rhythm: Normal rate and regular rhythm.     Heart sounds: Normal heart sounds.  Pulmonary:     Effort: Pulmonary effort is normal. No respiratory distress.     Breath sounds: Normal breath sounds.   Abdominal:     General: There is no distension.     Palpations: Abdomen is soft.     Tenderness: There is no abdominal tenderness.  Musculoskeletal:        General: No deformity or edema. Normal range of motion.     Cervical back: Normal range of motion and neck supple.  Skin:    General: Skin is warm and dry.  Neurological:     General: No focal deficit present.     Mental Status: She is alert and oriented to person, place, and time. Mental status is at baseline.  Psychiatric:        Mood and Affect: Mood and affect normal.     ED Results / Procedures / Treatments   Labs (all labs ordered are listed, but only abnormal results are displayed) Labs Reviewed - No data to display  EKG EKG Interpretation  Date/Time:  Monday December 01 2020 21:52:58 EST Ventricular Rate:  69 PR Interval:    QRS Duration: 92 QT Interval:  416 QTC Calculation: 446 R Axis:   76 Text Interpretation: Sinus rhythm Ventricular premature complex Borderline T abnormalities, anterior leads Confirmed by 04-07-1991 [phone]) on 12/01/2020 9:54:42 PM   Radiology No results found.  Procedures Procedures   Medications Ordered in ED Medications  acetaminophen (TYLENOL) tablet 1,000 mg (1,000 mg Oral Given 12/01/20 2159)  ondansetron (ZOFRAN) tablet 4 mg (4 mg Oral Given 12/01/20 2159)    ED Course  I have reviewed the triage vital signs and the nursing notes.  Pertinent labs & imaging results that were available during my care of the patient were reviewed by me and considered in my medical decision making (see chart for details).    MDM Rules/Calculators/A&P                          MDM  Screen complete  Tera Pellicane was evaluated in Emergency Department on 12/01/2020 for the symptoms described in the history of present illness. She was evaluated in the context of the global COVID-19 pandemic, which necessitated consideration that the patient might be at risk for infection with the  SARS-CoV-2 virus that causes COVID-19. Institutional protocols and algorithms that pertain to the evaluation of patients at risk for COVID-19 are in a state of rapid change based on information released by regulatory bodies including the CDC and  federal and state organizations. These policies and algorithms were followed during the patient's care in the ED.  Patient is presenting for evaluation after taking first dose of Cymbalta.  She reports that she took her dose of Cymbalta around 10 AM.  She is evaluated at 10 PM.  Her symptoms have resolved over the course of the last 12 hours.  Patient without indication for additional ED evaluation or treatment.  She is advised to discuss further treatment options with her regular care providers who have prescribed her Cymbalta.   Final Clinical Impression(s) / ED Diagnoses Final diagnoses:  Adverse effect of drug, initial encounter    Rx / DC Orders ED Discharge Orders    None       Wynetta Fines, MD 12/01/20 2242

## 2020-12-01 NOTE — Telephone Encounter (Signed)
I returned the call to the patient. States she started taking duloxetine 60mg  today. Reports shortly after taking the first dose, she started experiencing the following symptoms: dry mouth, headache, shortness of breath, throat tightness and suicidal thoughts. She is in the ED now for further evaluation. Duloxetine will be added to her allergy list. She will keep her pending NCV/EMG on 12/10/20.

## 2020-12-01 NOTE — ED Triage Notes (Signed)
Pt reports taking first dose of Cymbalta today and now feeling dizzy, funny in her throat, shob, and overall weird. Pt speaking in complete sentences without difficulty.

## 2020-12-01 NOTE — Addendum Note (Signed)
Addended by: Lindell Spar C on: 12/01/2020 04:10 PM   Modules accepted: Orders

## 2020-12-10 ENCOUNTER — Ambulatory Visit (INDEPENDENT_AMBULATORY_CARE_PROVIDER_SITE_OTHER): Payer: 59 | Admitting: Neurology

## 2020-12-10 DIAGNOSIS — M545 Low back pain, unspecified: Secondary | ICD-10-CM | POA: Diagnosis not present

## 2020-12-10 DIAGNOSIS — M79671 Pain in right foot: Secondary | ICD-10-CM

## 2020-12-10 DIAGNOSIS — M5440 Lumbago with sciatica, unspecified side: Secondary | ICD-10-CM

## 2020-12-10 DIAGNOSIS — M79672 Pain in left foot: Secondary | ICD-10-CM

## 2020-12-10 NOTE — Progress Notes (Addendum)
HISTORICAL  Jasmine Mckenzie is a 40 year old female, seen in request by her podiatrist Dr. Barkley Bruns, Jenny Reichmann for evaluation of bilateral foot pain,  I reviewed and summarized the referring note.PMHx. Idiopathic hypersomnolence disorder, please treated by sleep physician in [place], 10 mg twice a day since Nov 2021.  Chronic insomnia, taking melatonin 10 mg at bedtime as needed, Sonata  She complains long history of chronic low back, upper back, cervical, multiple hand and foot joints pain, become more noticeable since her left bunionectomy surgery in 2018.  She had her first right bunionectomy in 2017, recovered fine, had left bunionectomy in 2018,  Following that, she began to notice left foot pain, involving left first toe, extending to the dorsum left foot, few months later, similar involvement of right foot, now she complains of heel pain, plantar area pain, especially after bearing weight  Reported abnormal skin biopsy showed significant nerve damage but I do not have the formal report  Over the years, she also complains of worsening low back, upper back pain, has tried gabapentin up to 300 mg 3 times daily without significant benefit, stopped taking it since January 2022, received a chiropractor, physical therapy, with no significant improvement  She was also seen by orthopedic in the past, no significant abnormal MRI of lumbar spine, did have some improvement with epidural injection,  She worked at the post office third shift, 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, is under the sleep specialist care at [place], taking greater than 10 mg twice a day at 8 PM, 3 AM, meeting Sonata, or melatonin to falling to sleep, even that, she described difficulty falling to sleep, poor sleep quality, extreme fatigue,    Laboratory evaluations in May 2020, anemia hemoglobin of 10.8, normal CMP with exception of mildly decreased calcium 8.5, potassium of 3.1,  Emergency room presentation in June 2021, right foot x-ray  showed no acute abnormality, chronic postoperative change to the first metatarsal with metal K wire in place, mild pes planus,  Update December 10, 2020: She continue complains of significant low back pain, she does a lot of repetitive lifting at her work, NSAIDs was helpful, complains of GI irritation, at a dose of Cymbalta 60 mg, and up at emergency room same day December 01, 2020, complains of dizzy, feeling weak, nauseous, has bad thoughts, during emergency room, EKG was normal, symptom was improved with Benadryl, Zofran,  She is not taking Celebrex 200 mg daily, along with Tylenol,  She return for EMG nerve conduction study today, which was normal, no evidence of bilateral lower extremity neuropathy or lumbar radiculopathy,   Laboratory evaluation in January 2022 showed normal or negative protein electrophoresis, anemia with hemoglobin of 10.8, vitamin D 39.5, ANA, ESR, C-reactive protein, RPR, B12, CBC, CPK   REVIEW OF SYSTEMS: Full 14 system review of systems performed and notable only for as above All other review of systems were negative.  ALLERGIES: Allergies  Allergen Reactions  . Duloxetine Other (See Comments)    dry mouth, headache, shortness of breath, throat tightness and suicidal thoughts  . Amoxicillin     HOME MEDICATIONS: Current Outpatient Medications  Medication Sig Dispense Refill  . acetaminophen (TYLENOL) 325 MG tablet Take 650 mg by mouth as needed.    . celecoxib (CELEBREX) 200 MG capsule Take 200 mg by mouth daily.    Marland Kitchen MELATONIN GUMMIES PO Take 10 mg by mouth at bedtime as needed.    . methylphenidate (RITALIN) 10 MG tablet Take 10 mg by  mouth 2 (two) times daily.    . zaleplon (SONATA) 5 MG capsule Take 5 mg by mouth at bedtime as needed.     No current facility-administered medications for this visit.    PAST MEDICAL HISTORY: Past Medical History:  Diagnosis Date  . Arthritis   . Back pain   . Diabetes ([place])   . GERD (gastroesophageal reflux  disease)   . Heart palpitations   . IBS (irritable bowel syndrome)   . Idiopathic hypersomnia   . Neuropathy     PAST SURGICAL HISTORY: Past Surgical History:  Procedure Laterality Date  . BREAST SURGERY     augmentation  . CHOLECYSTECTOMY N/A 08/17/2016   Procedure: LAPAROSCOPIC CHOLECYSTECTOMY WITH INTRAOPERATIVE CHOLANGIOGRAM;  Surgeon: Donnie Mesa, MD;  Location: [facility];  Service: General;  Laterality: N/A;  . UMBILICAL HERNIA REPAIR N/A 08/17/2016   Procedure: HERNIA REPAIR UMBILICAL ADULT;  Surgeon: Donnie Mesa, MD;  Location[facility];  Service: General;  Laterality: N/A;    FAMILY HISTORY: Family History  Problem Relation Age of Onset  . Hypertension Mother   . Diabetes Father   . Hypertension Father     SOCIAL HISTORY: Social History   Socioeconomic History  . Marital status: Married    Spouse name: Not on file  . Number of children: 4  . Years of education: 88  . Highest education level: High school graduate  Occupational History  . 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 Smoking status: Never Smoker  . Smokeless tobacco: Never Used  Substance and Sexual Activity  . Alcohol use: Yes    Comment: occasional wine  . Drug use: Never  . Sexual activity: Yes    Birth control/protection: None  Other Topics Concern  . Not on file  Social History Narrative   Lives at home with family.   No daily use of caffeine.   Right-handed.   Social Determinants of Health   Financial Resource Strain: Not on file  Food Insecurity: Not on file  Transportation Needs: Not on file  Physical Activity: Not on file  Stress: Not on file  Social Connections: Not on file  Intimate Partner Violence: Not on file     PHYSICAL EXAM   There were no vitals filed for this visit. Not recorded     There is no height or weight on file to calculate BMI.  PHYSICAL EXAMNIATION:  Gen: NAD, conversant, well nourised, well groomed                  NEUROLOGICAL EXAM:  MENTAL STATUS: Speech/cognition: Awake, alert oriented to history taking care of conversation   CRANIAL NERVES: CN II: Visual fields are full to confrontation. Pupils are round equal and briskly reactive to light. CN III, IV, VI: extraocular movement are normal. No ptosis. CN V: Facial sensation is intact to light touch CN VII: Face is symmetric with normal eye closure  CN VIII: Hearing is normal to causal conversation. CN IX, X: Phonation is normal. CN XI: Head turning and shoulder shrug are intact  MOTOR: There is no pronator drift of out-stretched arms. Muscle bulk and tone are normal. Muscle strength is normal.  REFLEXES: Reflexes are 2+ and symmetric at the biceps, triceps, knees, and ankles. Plantar responses are flexor.  SENSORY: Intact to light touch, pinprick and vibratory sensation are intact in fingers and toes.  COORDINATION: There is no trunk or limb dysmetria noted.  GAIT/STANCE: She can get up from seated  position, arms crossed, bilateral flatfeet, steady gait Romberg is absent.   DIAGNOSTIC DATA (LABS, IMAGING, TESTING) - I reviewed patient records, labs, notes, testing and imaging myself where available.   ASSESSMENT AND PLAN  Oluwasemilore Pascuzzi is a 40 y.o. female   Long history of diffuse body achy pain, insomnia, worsening 5 years history of bilateral feet pain, pelvic pain,  Normal EMG nerve conduction study   Laboratory evaluation including inflammatory markers were within normal limit,  Most consistent with musculoskeletal strain,  She desire further evaluation initially, reviewing her history, physical examination, the most admitting for potential imaging study was MRI of lumbar, probably low yield with MRI of thoracic and cervical spine, patient decided not to proceed with imaging study.  Refer her to physical therapy  Emphasized importance of warm compression, stretching exercise  Marcial Pacas, M.D. Ph.D.  [facility] [address] [place], [place] [address] Ph: [phone] Fax: [phone]  CC:  Inocencio Homes, [place]. [place],  [place] [address]  Pa, [place]

## 2020-12-10 NOTE — Procedures (Signed)
Full Name: Jasmine Mckenzie Gender: Female MRN #: [id number] Date of Birth: 01-Oct-1981    Visit Date: 12/10/2020 08:09 Age: 40 Years Examining Physician: Levert Feinstein, MD  Referring Physician: Levert Feinstein, MD History: 40 year old female complains of chronic low back pain.  Summary of the test: Nerve conduction study: Bilateral sural, superficial peroneal sensory responses were normal.  Bilateral peroneal to EDB and tibial motor responses were normal.  Electromyography: Selected needle examination of bilateral lower extremity muscles and bilateral lumbosacral paraspinal muscles were normal.  Conclusion: This is a normal study.  There is no electrodiagnostic evidence of large fiber peripheral neuropathy or bilateral lumbosacral radiculopathy.    ------------------------------- Levert Feinstein, M.D. PhD  [facility] [address], [address] [place], [place] [address] Tel: [phone] Fax: [phone]  Verbal informed consent was obtained from the patient, patient was informed of potential risk of procedure, including bruising, bleeding, hematoma formation, infection, muscle weakness, muscle pain, numbness, among others.        MNC    Nerve / Sites Muscle Latency Ref. Amplitude Ref. Rel Amp Segments Distance Velocity Ref. Area    ms ms mV mV %  cm m/s m/s mVms  L Peroneal - EDB     Ankle EDB 3.9 ?6.5 6.9 ?2.0 100 Ankle - EDB 9   17.7     Fib head EDB 7.6  2.1  30 Fib head - Ankle 25 68 ?44 6.6     Pop fossa EDB 9.1  5.8  280 Pop fossa - Fib head 10 66 ?44 17.5         Pop fossa - Ankle      R Peroneal - EDB     Ankle EDB 3.3 ?6.5 7.9 ?2.0 100 Ankle - EDB 9   19.5     Fib head EDB 7.8  6.8  86.1 Fib head - Ankle 25 55 ?44 17.9     Pop fossa EDB 9.8  6.8  99.9 Pop fossa - Fib head 10 49 ?44 18.2         Pop fossa - Ankle      L Tibial - AH     Ankle AH 3.9 ?5.8 11.3 ?4.0 100 Ankle - AH 9   17.7     Pop fossa AH 11.2  4.2  37.3 Pop fossa - Ankle 34 47 ?41 8.1   R Tibial - AH     Ankle AH 3.9 ?5.8 11.2 ?4.0 100 Ankle - AH 9   20.2     Pop fossa AH 10.0  8.8  79 Pop fossa - Ankle 35 57 ?41 19.7             SNC    Nerve / Sites Rec. Site Peak Lat Ref.  Amp Ref. Segments Distance    ms ms V V  cm  L Sural - Ankle (Calf)     Calf Ankle 3.3 ?4.4 29 ?6 Calf - Ankle 14  R Sural - Ankle (Calf)     Calf Ankle 3.0 ?4.4 30 ?6 Calf - Ankle 14  L Superficial peroneal - Ankle     Lat leg Ankle 3.5 ?4.4 13 ?6 Lat leg - Ankle 14  R Superficial peroneal - Ankle     Lat leg Ankle 3.4 ?4.4 16 ?6 Lat leg - Ankle 14             F  Wave    Nerve F Lat Ref.  ms ms  L Tibial - AH 47.7 ?56.0  R Tibial - AH 44.7 ?56.0         H Reflex    Nerve H Lat   ms   Left Right Ref.  Tibial - Soleus 33.2 18.0 ?35.0         EMG Summary Table    Spontaneous MUAP Recruitment  Muscle IA Fib PSW Fasc Other Amp Dur. Poly Pattern  R. Tibialis anterior Normal None None None _______ Normal Normal Normal Normal  R. Tibialis posterior Normal None None None _______ Normal Normal Normal Normal  R. Peroneus longus Normal None None None _______ Normal Normal Normal Normal  R. Gastrocnemius (Medial head) Normal None None None _______ Normal Normal Normal Normal  R. Vastus lateralis Normal None None None _______ Normal Normal Normal Normal  L. Tibialis anterior Normal None None None _______ Normal Normal Normal Normal  L. Tibialis posterior Normal None None None _______ Normal Normal Normal Normal  L. Peroneus longus Normal None None None _______ Normal Normal Normal Normal  L. Gastrocnemius (Medial head) Normal None None None _______ Normal Normal Normal Normal  L. Vastus lateralis Normal None None None _______ Normal Normal Normal Normal  L. Lumbar paraspinals (low) Normal None None None _______ Normal Normal Normal Normal  L. Lumbar paraspinals (mid) Normal None None None _______ Normal Normal Normal Normal  R. Lumbar paraspinals (low) Normal None None None _______ Normal  Normal Normal Normal  R. Lumbar paraspinals (mid) Normal None None None _______ Normal Normal Normal Normal             Full Name: Jasmine Mckenzie Gender: Female MRN #: [id number] Date of Birth: 1981-09-12    Visit Date: 12/10/2020 08:09 Age: 63 Years Examining Physician: Levert Feinstein, MD  Referring Physician: Levert Feinstein, MD    Conclusion:     ------------------------------- Physician Name, M.D.  [facility] [address], [address] [place], [place] [address] Tel: [phone] Fax: [phone]  Verbal informed consent was obtained from the patient, patient was informed of potential risk of procedure, including bruising, bleeding, hematoma formation, infection, muscle weakness, muscle pain, numbness, among others.        MNC    Nerve / Sites Muscle Latency Ref. Amplitude Ref. Rel Amp Segments Distance Velocity Ref. Area    ms ms mV mV %  cm m/s m/s mVms  L Peroneal - EDB     Ankle EDB 3.9 ?6.5 6.9 ?2.0 100 Ankle - EDB 9   17.7     Fib head EDB 7.6  2.1  30 Fib head - Ankle 25 68 ?44 6.6     Pop fossa EDB 9.1  5.8  280 Pop fossa - Fib head 10 66 ?44 17.5         Pop fossa - Ankle      R Peroneal - EDB     Ankle EDB 3.3 ?6.5 7.9 ?2.0 100 Ankle - EDB 9   19.5     Fib head EDB 7.8  6.8  86.1 Fib head - Ankle 25 55 ?44 17.9     Pop fossa EDB 9.8  6.8  99.9 Pop fossa - Fib head 10 49 ?44 18.2         Pop fossa - Ankle      L Tibial - AH     Ankle AH 3.9 ?5.8 11.3 ?4.0 100 Ankle - AH 9   17.7     Pop fossa AH 11.2  4.2  37.3 Pop fossa - Ankle 34 47 ?41 8.1  R Tibial - AH     Ankle AH 3.9 ?5.8 11.2 ?4.0 100 Ankle - AH 9   20.2     Pop fossa AH 10.0  8.8  79 Pop fossa - Ankle 35 57 ?41 19.7             SNC    Nerve / Sites Rec. Site Peak Lat Ref.  Amp Ref. Segments Distance    ms ms V V  cm  L Sural - Ankle (Calf)     Calf Ankle 3.3 ?4.4 29 ?6 Calf - Ankle 14  R Sural - Ankle (Calf)     Calf Ankle 3.0 ?4.4 30 ?6 Calf - Ankle 14  L Superficial peroneal -  Ankle     Lat leg Ankle 3.5 ?4.4 13 ?6 Lat leg - Ankle 14  R Superficial peroneal - Ankle     Lat leg Ankle 3.4 ?4.4 16 ?6 Lat leg - Ankle 14             F  Wave    Nerve F Lat Ref.   ms ms  L Tibial - AH 47.7 ?56.0  R Tibial - AH 44.7 ?56.0         H Reflex    Nerve H Lat   ms   Left Right Ref.  Tibial - Soleus 33.2 18.0 ?35.0         EMG Summary Table    Spontaneous MUAP Recruitment  Muscle IA Fib PSW Fasc Other Amp Dur. Poly Pattern  R. Tibialis anterior Normal None None None _______ Normal Normal Normal Normal  R. Tibialis posterior Normal None None None _______ Normal Normal Normal Normal  R. Peroneus longus Normal None None None _______ Normal Normal Normal Normal  R. Gastrocnemius (Medial head) Normal None None None _______ Normal Normal Normal Normal  R. Vastus lateralis Normal None None None _______ Normal Normal Normal Normal  L. Tibialis anterior Normal None None None _______ Normal Normal Normal Normal  L. Tibialis posterior Normal None None None _______ Normal Normal Normal Normal  L. Peroneus longus Normal None None None _______ Normal Normal Normal Normal  L. Gastrocnemius (Medial head) Normal None None None _______ Normal Normal Normal Normal  L. Vastus lateralis Normal None None None _______ Normal Normal Normal Normal  L. Lumbar paraspinals (low) Normal None None None _______ Normal Normal Normal Normal  L. Lumbar paraspinals (mid) Normal None None None _______ Normal Normal Normal Normal  R. Lumbar paraspinals (low) Normal None None None _______ Normal Normal Normal Normal  R. Lumbar paraspinals (mid) Normal None None None _______ Normal Normal Normal Normal

## 2020-12-11 ENCOUNTER — Telehealth: Payer: Self-pay | Admitting: Neurology

## 2020-12-11 NOTE — Telephone Encounter (Signed)
no to the covid questions MR Lumbar spine wo contrast Dr. Terrace Arabia [facility] Berkley Harvey: [id number] (exp. 12/10/20 to 01/24/21). patient is scheduled at [facility] for 12/17/20.

## 2020-12-17 ENCOUNTER — Ambulatory Visit: Payer: 59

## 2020-12-17 NOTE — Telephone Encounter (Signed)
Jacky Hartung is a 40 y.o. female   Long history of diffuse body achy pain, insomnia, worsening 5 years history of bilateral feet pain, pelvic pain,             Normal EMG nerve conduction study              Laboratory evaluation including inflammatory markers were within normal limit,             Most consistent with musculoskeletal strain,             She desire further evaluation, proceed with MRI of lumbar spine             Refer her to physical therapy             Emphasized importance of warm compression, stretching exercise   Based on discussion from last office visit December 10, 2020, will proceed with MRI of lumbar,

## 2020-12-17 NOTE — Telephone Encounter (Signed)
Patient returned you call. 

## 2020-12-17 NOTE — Telephone Encounter (Signed)
Called patient and informed her of Dr Zannie Cove note. She denied pain in her lower back and denied pelvic pain, stated she has pain in the middle of her back and wants MRI of that area. She stated she had MRI of lower back a few years ago, was told she has arthritis. She is asking why MD will not order MRI of middle of her back. She would like explanation. I advised will send her question to Dr Terrace Arabia and let her know Patient verbalized understanding, appreciation.

## 2020-12-17 NOTE — Telephone Encounter (Signed)
LVM requesting call back re: MRI lumbar spine.

## 2020-12-17 NOTE — Telephone Encounter (Signed)
Patient came in today for her MRI Lumbar spine.Jasmine Mckenzie the tech came by and said the patient was under the impression that she was going to get a Thoracic spine done instead. She says she is not having problems with her lower back its her mid back she is having problems with.  Please advise?

## 2020-12-22 NOTE — Addendum Note (Signed)
Addended by: Levert Feinstein on: 12/22/2020 08:22 AM   Modules accepted: Orders, Level of Service

## 2020-12-22 NOTE — Telephone Encounter (Signed)
Noted Called and checked referral . Referral in  Neuro - Rehab work - que . They have not called to schedule yet. I will send My- chart Message to patient so she can call . Thanks Annabelle Harman

## 2020-12-22 NOTE — Telephone Encounter (Signed)
I have discussed with patient, she decided not to proceed with MRI of lumbar spine as discussed during previous visit, I canceled the order  Please refund the patient if she has paid related expense.  Follow-up with her primary care physician for her reported chronic upper, and cervical pain  Please also check on her physical therapy refer

## 2020-12-24 ENCOUNTER — Other Ambulatory Visit: Payer: Self-pay

## 2020-12-24 ENCOUNTER — Ambulatory Visit: Payer: 59 | Attending: Neurology | Admitting: Rehabilitation

## 2020-12-24 ENCOUNTER — Encounter: Payer: Self-pay | Admitting: Rehabilitation

## 2020-12-24 DIAGNOSIS — M542 Cervicalgia: Secondary | ICD-10-CM | POA: Diagnosis not present

## 2020-12-24 DIAGNOSIS — M6281 Muscle weakness (generalized): Secondary | ICD-10-CM | POA: Insufficient documentation

## 2020-12-24 DIAGNOSIS — M546 Pain in thoracic spine: Secondary | ICD-10-CM | POA: Insufficient documentation

## 2020-12-24 DIAGNOSIS — R293 Abnormal posture: Secondary | ICD-10-CM | POA: Insufficient documentation

## 2020-12-24 NOTE — Addendum Note (Signed)
Addended by: Harriet Butte A on: 12/24/2020 11:22 AM   Modules accepted: Orders

## 2020-12-24 NOTE — Therapy (Signed)
[facility] [address] [place], [place], [address] Phone: [phone]   Fax:  [phone]  Physical Therapy Treatment  Patient Details  Name: Jasmine SimsLinda Ann Mckenzie MRN: [id number] Date of Birth: 09/13/1981 Referring Provider (PT): Levert FeinsteinYijun Yan, MD   Encounter Date: 12/24/2020   PT End of Session - 12/24/20 1052    Visit Number 1    Number of Visits 17    Date for PT Re-Evaluation 02/22/21    Authorization Type [employer]    Authorization - Visit Number 1    Authorization - Number of Visits 60    PT Start Time 0933    PT Stop Time 1020    PT Time Calculation (min) 47 min    Activity Tolerance Patient tolerated treatment well    Behavior During Therapy [facility] for tasks assessed/performed           Past Medical History:  Diagnosis Date  . Arthritis   . Back pain   . Diabetes (HCC)   . GERD (gastroesophageal reflux disease)   . Heart palpitations   . IBS (irritable bowel syndrome)   . Idiopathic hypersomnia   . Neuropathy     Past Surgical History:  Procedure Laterality Date  . BREAST SURGERY     augmentation  . CHOLECYSTECTOMY N/A 08/17/2016   Procedure: LAPAROSCOPIC CHOLECYSTECTOMY WITH INTRAOPERATIVE CHOLANGIOGRAM;  Surgeon: Manus RuddMatthew Tsuei, MD;  Location: [facility] OR;  Service: General;  Laterality: N/A;  . UMBILICAL HERNIA REPAIR N/A 08/17/2016   Procedure: HERNIA REPAIR UMBILICAL ADULT;  Surgeon: Manus RuddMatthew Tsuei, MD;  Location: [facility] OR;  Service: General;  Laterality: N/A;    There were no vitals filed for this visit.   Subjective Assessment - 12/24/20 0937    Subjective Per pt, "I've been havig back pain for years now.  Its now pretty severe.  My spine just hurts, its even tender to touch.  I'm not sure if its related to work but its getting worse.  I am having headaches on and off.    Pertinent History Hx of low back pain, spinal    How long can you sit comfortably? 2-3 mins (sitting is worse)    How long can you stand  comfortably? 20 mins or so    How long can you walk comfortably? 10-20 mins    Patient Stated Goals "I want to get out of bed without pain"    Currently in Pain? Yes    Pain Score 10-Worst pain ever    Pain Location Neck    Pain Orientation Mid;Medial;Upper    Pain Descriptors / Indicators Aching;Throbbing;Headache;Shooting    Pain Type Chronic pain    Pain Radiating Towards sometimes has shooting pain into R arm    Pain Onset More than a month ago    Pain Frequency Constant    Aggravating Factors  from the moment she wakes up its painful    Pain Relieving Factors thermacare patches, heating pad, stretches              [facility] PT Assessment - 12/24/20 0950      Assessment   Medical Diagnosis mid and upper back pain    Referring Provider (PT) Levert FeinsteinYijun Yan, MD    Onset Date/Surgical Date --   began to worsen over the last two years   Prior Therapy OP PT at several locations in past      Balance Screen   Has the patient fallen in the past 6 months No  Has the patient had a decrease in activity level because of a fear of falling?  No    Is the patient reluctant to leave their home because of a fear of falling?  No      Home Environment   Living Environment Private residence    Living Arrangements Spouse/significant other    Available Help at Discharge Family;Available PRN/intermittently    Type of Home House    Home Access Stairs to enter      Prior Function   Level of Independence Independent    Vocation Full time employment    Vocation Requirements 1030-7 (night shift), needs to lift up 70#    Leisure Travel, go out      Cognition   Overall Cognitive Status Within Functional Limits for tasks assessed      Observation/Other Assessments   Observations sits off to the side, with head tilted to the left      Sensation   Light Touch Appears Intact      Coordination   Gross Motor Movements are Fluid and Coordinated Yes    Fine Motor Movements are Fluid and Coordinated  Yes      Posture/Postural Control   Posture/Postural Control Postural limitations    Postural Limitations Rounded Shoulders;Forward head;Increased thoracic kyphosis;Weight shift left    Posture Comments Discussed sleeping.  She reports bed is firm.  Uses one pillow, sometimes no pillow.  PT recommended more ergonomic pillow made for side/stomach sleepers.  Pt took pic from [facility] site.      ROM / Strength   AROM / PROM / Strength AROM;Strength      AROM   Overall AROM  Deficits    AROM Assessment Site Cervical    Cervical Flexion 40    Cervical Extension 52    Cervical - Right Side Bend 31    Cervical - Left Side Bend 27    Cervical - Right Rotation 41    Cervical - Left Rotation 42      Strength   Strength Assessment Site Shoulder;Elbow;Wrist    Right/Left Shoulder Right;Left    Right Shoulder Flexion 3+/5    Right Shoulder ABduction 3+/5    Left Shoulder Flexion 5/5    Left Shoulder ABduction 4/5   painful   Right/Left Elbow Right;Left    Right Elbow Flexion 5/5    Right Elbow Extension 5/5    Left Elbow Flexion 5/5    Left Elbow Extension 5/5      Palpation   Palpation comment Supine PA mobs (gentle 1/2 grade) demo mostly normal mobility with some tenderness and decreased mobility at cervicothoracic junction.                                 PT Education - 12/24/20 1051    Education Details discussed POC, goals, evaluation findings.    Person(s) Educated Patient    Methods Explanation    Comprehension Verbalized understanding            PT Short Term Goals - 12/24/20 1107      PT SHORT TERM GOAL #1   Title Pt will be IND with initial HEP in order to indicate reduced pain and improved function.  (Target Date: 01/23/21)    Time 4    Period Weeks    Status New    Target Date 01/23/21      PT SHORT TERM GOAL #2  Title Pt will report no more than 8/10 pain with daily activities in order to indicate pain is dec limiting factor.    Time 4     Period Weeks    Status New      PT SHORT TERM GOAL #3   Title Pt will improve cervical rotation by 10 deg each direction in order to indicate more functional ROM and safety with driving.    Baseline 41 deg R, 42 deg L on eval    Time 4    Period Weeks    Status New      PT SHORT TERM GOAL #4   Title Pt will demonstrate improved body mechanics with lifting in order to reduce pain with work activities.    Time 4    Period Weeks    Status New      PT SHORT TERM GOAL #5   Title Will assess NDI and write LTG to reflect decreased neck disability with functional activities.    Time 4    Period Weeks    Status New             PT Long Term Goals - 12/24/20 1112      PT LONG TERM GOAL #1   Title Pt will be IND with final HEP in order to indicate dec pain and improve function. (Target Date: 02/22/21)    Time 8    Period Weeks    Status New    Target Date 02/22/21      PT LONG TERM GOAL #2   Title Pt will demonstrate safe overhead lifting of up to 30# in order to perform work duties more safely.    Time 8    Period Weeks    Status Revised      PT LONG TERM GOAL #3   Title Pt will safely lift/lower 40#s for work related activities with no more than 5/10 pain and safe body mechanics.    Time 8    Period Weeks    Status New      PT LONG TERM GOAL #4   Title Pt will improve cervical ROM to >/=60 deg bilaterally in order to improve function.    Time 8    Period Weeks    Status New                 Plan - 12/24/20 1053    Clinical Impression Statement Pt presents with worsening mid to upper back pain which has increased over the past two years.  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  She reports she wakes up with pain and it increases over the day. 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  Upon PT evaluation, has decreased strength in  RUE, but pain with LUE ROM, decreased cervical rotation with pain, poor posture and 10/10 pain most of the time.  She has not recieved an MRI of neck despite requesting one from MD.  She reports she may switch neurologist and try and get an MRI.  Feel this may not be a bad idea since she is having 10/10 pain most of the time and the weakness in RUE.  Will send request to referring MD as well.  Pt will benefit from skilled OP to address deficits.    Personal Factors and Comorbidities Comorbidity 3+;Time since onset of injury/illness/exacerbation;Profession    Comorbidities see above  Examination-Activity Limitations Carry;Lift;Locomotion Level;Sit;Sleep;Stand;Reach Overhead    Examination-Participation Restrictions Occupation;Driving   travel   Stability/Clinical Decision Making Evolving/Moderate complexity    Clinical Decision Making Moderate    Rehab Potential Good    PT Frequency 2x / week    PT Duration 8 weeks    PT Treatment/Interventions ADLs/Self Care Home Management;Aquatic Therapy;Electrical Stimulation;Moist Heat;Traction;Ultrasound;Functional mobility training;Therapeutic activities;Therapeutic exercise;Neuromuscular re-education;Patient/family education;Manual techniques;Passive range of motion;Dry needling;Taping;Spinal Manipulations    PT Next Visit Plan Have her do NDI-add LTG, Provide pt with HEP on cervical retraction, postural exercises, core strengthening.  Also need to work on Estate manager/land agent and safe lifting overhead.    Consulted and Agree with Plan of Care Patient           Patient will benefit from skilled therapeutic intervention in order to improve the following deficits and impairments:  Decreased mobility,Decreased range of motion,Decreased strength,Hypomobility,Impaired flexibility,Impaired perceived functional ability,Impaired sensation,Impaired UE functional use,Postural dysfunction,Pain  Visit Diagnosis: Cervicalgia  Abnormal posture  Muscle weakness  (generalized)  Pain in thoracic spine     Problem List Patient Active Problem List   Diagnosis Date Noted  . Bilateral low back pain with sciatica 12/10/2020  . Pain in both feet 11/27/2020  . Chronic cholecystitis with calculus 08/17/2016    Harriet Butte, PT, MPT [facility] [address] [place], [place], [address] Phone: [phone]   Fax:  [phone] 12/24/20, 11:19 AM  Name: Tamkia Temples MRN: [id number] Date of Birth: Dec 29, 1980

## 2020-12-24 NOTE — Patient Instructions (Signed)
Verbal and demo instruction on cervical retraction exercise to press into either car head rest or sit up against wall with small towel roll/pillow behind head to "push into" and hold for 3-5 seconds, several times a day.

## 2020-12-30 ENCOUNTER — Ambulatory Visit: Payer: 59 | Attending: Neurology | Admitting: Physical Therapy

## 2020-12-30 ENCOUNTER — Encounter: Payer: Self-pay | Admitting: Physical Therapy

## 2020-12-30 ENCOUNTER — Other Ambulatory Visit: Payer: Self-pay

## 2020-12-30 DIAGNOSIS — M542 Cervicalgia: Secondary | ICD-10-CM | POA: Insufficient documentation

## 2020-12-30 DIAGNOSIS — M546 Pain in thoracic spine: Secondary | ICD-10-CM | POA: Diagnosis present

## 2020-12-30 DIAGNOSIS — M6281 Muscle weakness (generalized): Secondary | ICD-10-CM | POA: Diagnosis present

## 2020-12-30 DIAGNOSIS — R293 Abnormal posture: Secondary | ICD-10-CM | POA: Insufficient documentation

## 2020-12-30 NOTE — Therapy (Signed)
[facility] [address] [place], [place], [address] Phone: [phone]   Fax:  [phone]  Physical Therapy Treatment  Patient Details  Name: Jasmine Mckenzie MRN: [id number] Date of Birth: 03/16/1981 Referring Provider (PT): Levert Feinstein, MD   Encounter Date: 12/30/2020   PT End of Session - 12/30/20 0811    Visit Number 2    Number of Visits 17    Date for PT Re-Evaluation 02/22/21    Authorization Type UHC    Authorization - Visit Number 2    Authorization - Number of Visits 60    PT Start Time 0807    PT Stop Time 0845    PT Time Calculation (min) 38 min    Activity Tolerance Patient tolerated treatment well;No increased pain    Behavior During Therapy WFL for tasks assessed/performed           Past Medical History:  Diagnosis Date  . Arthritis   . Back pain   . Diabetes (HCC)   . GERD (gastroesophageal reflux disease)   . Heart palpitations   . IBS (irritable bowel syndrome)   . Idiopathic hypersomnia   . Neuropathy     Past Surgical History:  Procedure Laterality Date  . BREAST SURGERY     augmentation  . CHOLECYSTECTOMY N/A 08/17/2016   Procedure: LAPAROSCOPIC CHOLECYSTECTOMY WITH INTRAOPERATIVE CHOLANGIOGRAM;  Surgeon: Manus Rudd, MD;  Location: [facility];  Service: General;  Laterality: N/A;  . UMBILICAL HERNIA REPAIR N/A 08/17/2016   Procedure: HERNIA REPAIR UMBILICAL ADULT;  Surgeon: Manus Rudd, MD;  Location: [facility] OR;  Service: General;  Laterality: N/A;    There were no vitals filed for this visit.   Subjective Assessment - 12/30/20 0809    Subjective No new complaints. Still with min to upper back pain, 8/10. Dr. Terrace Arabia ordered a low back MRI. She didn't have it done as that "is not my issue".    Pertinent History Hx of low back pain, spinal    How long can you sit comfortably? 2-3 mins (sitting is worse)    How long can you stand comfortably? 20 mins or so    How long can you walk  comfortably? 10-20 mins    Patient Stated Goals "I want to get out of bed without pain"    Currently in Pain? Yes    Pain Score 8     Pain Location Neck    Pain Orientation Mid;Medial;Upper    Pain Descriptors / Indicators Aching;Throbbing;Headache;Shooting    Pain Type Chronic pain    Pain Onset More than a month ago    Pain Frequency Constant    Aggravating Factors  hurts from the moment she wakes up, does wake her up at night    Pain Relieving Factors thermacare patches, heating pads, stretches                [facility] Adult PT Treatment/Exercise - 12/30/20 0815      Self-Care   Self-Care Other Self-Care Comments    Other Self-Care Comments  had pt complete NDI survey with primary PT to complete goal update. Pt's score today was 60% (Severe Disability).      Exercises   Exercises Other Exercises    Other Exercises  issued ex' s for stretching to HEP. Refer to [employer] for full details. cues needed on correct form/technique.      Ultrasound   Ultrasound Location bil cervical/thoracic paraspinals/upper traps    Ultrasound Parameters 10 minutes,  100%, 1.5 w/cm2, 1 Mhz    Ultrasound Goals Pain               PT Education - 12/30/20 0847    Education Details results of NDI; indications for/what to expect with ultrasound; Initial HEP            PT Short Term Goals - 12/24/20 1107      PT SHORT TERM GOAL #1   Title Pt will be IND with initial HEP in order to indicate reduced pain and improved function.  (Target Date: 01/23/21)    Time 4    Period Weeks    Status New    Target Date 01/23/21      PT SHORT TERM GOAL #2   Title Pt will report no more than 8/10 pain with daily activities in order to indicate pain is dec limiting factor.    Time 4    Period Weeks    Status New      PT SHORT TERM GOAL #3   Title Pt will improve cervical rotation by 10 deg each direction in order to indicate more functional ROM and safety with driving.    Baseline 41 deg R, 42 deg L  on eval    Time 4    Period Weeks    Status New      PT SHORT TERM GOAL #4   Title Pt will demonstrate improved body mechanics with lifting in order to reduce pain with work activities.    Time 4    Period Weeks    Status New      PT SHORT TERM GOAL #5   Title Will assess NDI and write LTG to reflect decreased neck disability with functional activities.    Time 4    Period Weeks    Status New             PT Long Term Goals - 12/24/20 1112      PT LONG TERM GOAL #1   Title Pt will be IND with final HEP in order to indicate dec pain and improve function. (Target Date: 02/22/21)    Time 8    Period Weeks    Status New    Target Date 02/22/21      PT LONG TERM GOAL #2   Title Pt will demonstrate safe overhead lifting of up to 30# in order to perform work duties more safely.    Time 8    Period Weeks    Status Revised      PT LONG TERM GOAL #3   Title Pt will safely lift/lower 40#s for work related activities with no more than 5/10 pain and safe body mechanics.    Time 8    Period Weeks    Status New      PT LONG TERM GOAL #4   Title Pt will improve cervical ROM to >/=60 deg bilaterally in order to improve function.    Time 8    Period Weeks    Status New                 Plan - 12/30/20 0017    Clinical Impression Statement Today's skilled session initially focused on setting a baseline score on the NDI with pt scoring 60% Severe Disability. Remainder of session focused on trial of ultrasound for pain reduction and issuing of an HEP with stretching to also reduce tightness/pain. No issues noted or reported in session. No increase in pain reported.  The pt should benefit from continued PT to progress toward unmet goals.    Personal Factors and Comorbidities Comorbidity 3+;Time since onset of injury/illness/exacerbation;Profession    Comorbidities see above    Examination-Activity Limitations Carry;Lift;Locomotion Level;Sit;Sleep;Stand;Reach Overhead     Examination-Participation Restrictions Occupation;Driving   travel   Stability/Clinical Decision Making Evolving/Moderate complexity    Rehab Potential Good    PT Frequency 2x / week    PT Duration 8 weeks    PT Treatment/Interventions ADLs/Self Care Home Management;Aquatic Therapy;Electrical Stimulation;Moist Heat;Traction;Ultrasound;Functional mobility training;Therapeutic activities;Therapeutic exercise;Neuromuscular re-education;Patient/family education;Manual techniques;Passive range of motion;Dry needling;Taping;Spinal Manipulations    PT Next Visit Plan PT to add NDI LTG. How did ultrasound work? manual techniques for pain/muscle tightness. review stretches on HEP as needed, add postural/core strengthening ex's. 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    Consulted and Agree with Plan of Care Patient           Patient will benefit from skilled therapeutic intervention in order to improve the following deficits and impairments:  Decreased mobility,Decreased range of motion,Decreased strength,Hypomobility,Impaired flexibility,Impaired perceived functional ability,Impaired sensation,Impaired UE functional use,Postural dysfunction,Pain  Visit Diagnosis: Cervicalgia  Abnormal posture  Muscle weakness (generalized)     Problem List Patient Active Problem List   Diagnosis Date Noted  . Bilateral low back pain with sciatica 12/10/2020  . Pain in both feet 11/27/2020  . Chronic cholecystitis with calculus 08/17/2016    Sallyanne Kuster, PTA, [facility] [address] [place], [place] [address] [phone] 12/30/20, 9:43 PM   Name: Geana Walts MRN: [id number] Date of Birth: 1981/04/19

## 2020-12-30 NOTE — Patient Instructions (Signed)
Access Code: [facility] URL: [email or web address] Date: 12/30/2020 Prepared by: Sallyanne Kuster  Exercises Seated Chin Tuck with Neck Elongation - 1 x daily - 5 x weekly - 1 sets - 10 reps - 5 hold Seated Scapular Retraction - 1 x daily - 5 x weekly - 1 sets - 10 reps - 3-5 hold Seated Cervical Sidebending Stretch - 1 x daily - 5 x weekly - 1 sets - 3 reps - 30 hold Seated Levator Scapulae Stretch - 1 x daily - 5 x weekly - 1 sets - 10 reps

## 2021-01-01 ENCOUNTER — Other Ambulatory Visit: Payer: Self-pay

## 2021-01-01 ENCOUNTER — Encounter: Payer: Self-pay | Admitting: Physical Therapy

## 2021-01-01 ENCOUNTER — Ambulatory Visit: Payer: 59 | Admitting: Physical Therapy

## 2021-01-01 DIAGNOSIS — R293 Abnormal posture: Secondary | ICD-10-CM

## 2021-01-01 DIAGNOSIS — M6281 Muscle weakness (generalized): Secondary | ICD-10-CM

## 2021-01-01 DIAGNOSIS — M542 Cervicalgia: Secondary | ICD-10-CM | POA: Diagnosis not present

## 2021-01-01 DIAGNOSIS — M546 Pain in thoracic spine: Secondary | ICD-10-CM

## 2021-01-01 NOTE — Therapy (Signed)
[facility] [address] [place], [place], [address] Phone: [phone]   Fax:  [phone]  Physical Therapy Treatment  Patient Details  Name: Jasmine Mckenzie MRN: [id number] Date of Birth: 12/28/1980 Referring Provider (PT): Marcial Pacas, MD   Encounter Date: 01/01/2021   PT End of Session - 01/01/21 0807    Visit Number 3    Number of Visits 17    Date for PT Re-Evaluation 02/22/21    Authorization Type UHC    Authorization - Visit Number 3    Authorization - Number of Visits 60    PT Start Time 0803    PT Stop Time 0845    PT Time Calculation (min) 42 min    Activity Tolerance Patient tolerated treatment well;No increased pain    Behavior During Therapy WFL for tasks assessed/performed           Past Medical History:  Diagnosis Date  . Arthritis   . Back pain   . Diabetes ([place])   . GERD (gastroesophageal reflux disease)   . Heart palpitations   . IBS (irritable bowel syndrome)   . Idiopathic hypersomnia   . Neuropathy     Past Surgical History:  Procedure Laterality Date  . BREAST SURGERY     augmentation  . CHOLECYSTECTOMY N/A 08/17/2016   Procedure: LAPAROSCOPIC CHOLECYSTECTOMY WITH INTRAOPERATIVE CHOLANGIOGRAM;  Surgeon: Donnie Mesa, MD;  Location: [place];  Service: General;  Laterality: N/A;  . UMBILICAL HERNIA REPAIR N/A 08/17/2016   Procedure: HERNIA REPAIR UMBILICAL ADULT;  Surgeon: Donnie Mesa, MD;  Location: [place];  Service: General;  Laterality: N/A;    There were no vitals filed for this visit.   Subjective Assessment - 01/01/21 0805    Subjective No changes. Pain is mostly in her mid thoracic area "where it hurts the most".    Pertinent History Hx of low back pain, spinal    How long can you sit comfortably? 2-3 mins (sitting is worse)    How long can you stand comfortably? 20 mins or so    How long can you walk comfortably? 10-20 mins    Patient Stated Goals "I want to get out  of bed without pain"    Currently in Pain? Yes    Pain Score 8     Pain Location Back    Pain Orientation Mid;Medial;Upper    Pain Descriptors / Indicators Aching;Throbbing    Pain Type Acute pain;Chronic pain    Pain Onset More than a month ago    Pain Frequency Constant    Aggravating Factors  hurts all the time    Pain Relieving Factors thermacare patches, heating pads, stretches                [facility] Adult PT Treatment/Exercise - 01/01/21 0955      Ultrasound   Ultrasound Location bil thoracic paraspinals    Ultrasound Parameters 10 minutes, 100%, 1 mhz, 1.5 w/cm2    Ultrasound Goals Pain      Manual Therapy   Manual Therapy Soft tissue mobilization;Myofascial release;Muscle Energy Technique    Manual therapy comments all manual therapy performed for decreased pain and muscle tightness. pt reporting minor improvement after session.    Soft tissue mobilization along thoracic paraspinals and right subscapular area    Myofascial Release use of myofascial ball along bil thoracic parapinals for decreased muscle tightness    Muscle Energy Technique with gentle overpressure along thoracic paraspinals concurrent with pt preforming  partial press up to promote improved mobility of muscles/spine.               PT Short Term Goals - 12/24/20 1107      PT SHORT TERM GOAL #1   Title Pt will be IND with initial HEP in order to indicate reduced pain and improved function.  (Target Date: 01/23/21)    Time 4    Period Weeks    Status New    Target Date 01/23/21      PT SHORT TERM GOAL #2   Title Pt will report no more than 8/10 pain with daily activities in order to indicate pain is dec limiting factor.    Time 4    Period Weeks    Status New      PT SHORT TERM GOAL #3   Title Pt will improve cervical rotation by 10 deg each direction in order to indicate more functional ROM and safety with driving.    Baseline 41 deg R, 42 deg L on eval    Time 4    Period Weeks    Status  New      PT SHORT TERM GOAL #4   Title Pt will demonstrate improved body mechanics with lifting in order to reduce pain with work activities.    Time 4    Period Weeks    Status New      PT SHORT TERM GOAL #5   Title Will assess NDI and write LTG to reflect decreased neck disability with functional activities.    Time 4    Period Weeks    Status New             PT Long Term Goals - 12/24/20 1112      PT LONG TERM GOAL #1   Title Pt will be IND with final HEP in order to indicate dec pain and improve function. (Target Date: 02/22/21)    Time 8    Period Weeks    Status New    Target Date 02/22/21      PT LONG TERM GOAL #2   Title Pt will demonstrate safe overhead lifting of up to 30# in order to perform work duties more safely.    Time 8    Period Weeks    Status Revised      PT LONG TERM GOAL #3   Title Pt will safely lift/lower 40#s for work related activities with no more than 5/10 pain and safe body mechanics.    Time 8    Period Weeks    Status New      PT LONG TERM GOAL #4   Title Pt will improve cervical ROM to >/=60 deg bilaterally in order to improve function.    Time 8    Period Weeks    Status New                 Plan - 01/01/21 0807    Clinical Impression Statement Today's skilled session continued to focus on decreased pain and muscle tightness along pt's thoracic spine into her cervical spine area. Pt reporting minimal improvements after session. Discussed transfer to our otho clinic where pt's needs can be better met. Pt in agreement with this as she has been there before for back pain issues.    Personal Factors and Comorbidities Comorbidity 3+;Time since onset of injury/illness/exacerbation;Profession    Comorbidities see above    Examination-Activity Limitations Carry;Lift;Locomotion Level;Sit;Sleep;Stand;Reach Overhead    Examination-Participation Restrictions  Occupation;Driving   travel   Stability/Clinical Decision Making  Evolving/Moderate complexity    Rehab Potential Good    PT Frequency 2x / week    PT Duration 8 weeks    PT Treatment/Interventions ADLs/Self Care Home Management;Aquatic Therapy;Electrical Stimulation;Moist Heat;Traction;Ultrasound;Functional mobility training;Therapeutic activities;Therapeutic exercise;Neuromuscular re-education;Patient/family education;Manual techniques;Passive range of motion;Dry needling;Taping;Spinal Manipulations    PT Next Visit Plan Tranfer to Ortho (will be split over the next 2 weeks due to busy schedules with plan to fully transfer to [facility] clinic). PT to add NDI LTG. How did ultrasound work in the area performed today? manual techniques for pain/muscle tightness. review stretches on HEP as needed, add postural/core strengthening ex's. 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    Consulted and Agree with Plan of Care Patient           Patient will benefit from skilled therapeutic intervention in order to improve the following deficits and impairments:  Decreased mobility,Decreased range of motion,Decreased strength,Hypomobility,Impaired flexibility,Impaired perceived functional ability,Impaired sensation,Impaired UE functional use,Postural dysfunction,Pain  Visit Diagnosis: Cervicalgia  Abnormal posture  Muscle weakness (generalized)  Pain in thoracic spine     Problem List Patient Active Problem List   Diagnosis Date Noted  . Bilateral low back pain with sciatica 12/10/2020  . Pain in both feet 11/27/2020  . Chronic cholecystitis with calculus 08/17/2016    Willow Ora, PTA, [place] [facility] [address] [place], [place] [address] [phone] 01/01/21, 10:07 AM   Name: Jasmine Mckenzie MRN: [id number] Date of Birth: 08/05/1981

## 2021-01-06 ENCOUNTER — Other Ambulatory Visit: Payer: Self-pay

## 2021-01-06 ENCOUNTER — Ambulatory Visit: Payer: 59 | Admitting: Physical Therapy

## 2021-01-06 ENCOUNTER — Encounter: Payer: Self-pay | Admitting: Physical Therapy

## 2021-01-06 DIAGNOSIS — M546 Pain in thoracic spine: Secondary | ICD-10-CM

## 2021-01-06 DIAGNOSIS — M542 Cervicalgia: Secondary | ICD-10-CM

## 2021-01-06 DIAGNOSIS — M6281 Muscle weakness (generalized): Secondary | ICD-10-CM

## 2021-01-06 DIAGNOSIS — R293 Abnormal posture: Secondary | ICD-10-CM

## 2021-01-06 NOTE — Therapy (Signed)
[facility] [address] [place], [place], [address] Phone: [phone]   Fax:  [phone]  Physical Therapy Treatment  Patient Details  Name: Jasmine Mckenzie MRN: [id number] Date of Birth: [birth year] Referring Provider (PT): Levert Feinstein, MD   Encounter Date: 01/06/2021   PT End of Session - 01/06/21 0807    Visit Number 4    Number of Visits 17    Date for PT Re-Evaluation 02/22/21    Authorization Type UHC    Authorization - Visit Number 4    Authorization - Number of Visits 60    PT Start Time 0805    PT Stop Time 0845    PT Time Calculation (min) 40 min    Activity Tolerance Patient tolerated treatment well;No increased pain    Behavior During Therapy WFL for tasks assessed/performed           Past Medical History:  Diagnosis Date  . Arthritis   . Back pain   . Diabetes (HCC)   . GERD (gastroesophageal reflux disease)   . Heart palpitations   . IBS (irritable bowel syndrome)   . Idiopathic hypersomnia   . Neuropathy     Past Surgical History:  Procedure Laterality Date  . BREAST SURGERY     augmentation  . CHOLECYSTECTOMY N/A 08/17/2016   Procedure: LAPAROSCOPIC CHOLECYSTECTOMY WITH INTRAOPERATIVE CHOLANGIOGRAM;  Surgeon: Manus Rudd, MD;  Location: [facility];  Service: General;  Laterality: N/A;  . UMBILICAL HERNIA REPAIR N/A 08/17/2016   Procedure: HERNIA REPAIR UMBILICAL ADULT;  Surgeon: Manus Rudd, MD;  [facility];  Service: General;  Laterality: N/A;    There were no vitals filed for this visit.   Subjective Assessment - 01/06/21 0806    Subjective No changes. Pain remains in her mid thoracic area "where it hurts the most".    Pertinent History Hx of low back pain, spinal    How long can you sit comfortably? 2-3 mins (sitting is worse)    How long can you stand comfortably? 20 mins or so    How long can you walk comfortably? 10-20 mins    Patient Stated Goals "I want to get out of  bed without pain"    Currently in Pain? Yes    Pain Score 9     Pain Location Back    Pain Orientation Mid;Medial;Upper    Pain Descriptors / Indicators Aching;Throbbing    Pain Type Acute pain;Chronic pain    Pain Onset More than a month ago    Pain Frequency Constant    Aggravating Factors  hurts all the time    Pain Relieving Factors thermacare patches, heating pads, stretches                 [facility] Adult PT Treatment/Exercise - 01/06/21 0808      Exercises   Exercises Other Exercises    Other Exercises  with red theraband: rows, shoulder extension and shoulder horizontal abduction for 10 reps each, cues for slow and controlled movements. seated at edge of mat: Levator Scapula stretch for 30 sec's x 2 reps each side. cues for hold time needed; Then seated in chair with pool noodle- horizontal just below the scapulas with pt clasping hands behind her neck for thoracic extension over the noodle for 10 reps. then with the noodle lateral along spine- scapular retraction x 10 reps.      Ultrasound   Ultrasound Location bil thoracic paraspinals    Ultrasound Parameters  10 minutes, 100%, 1.5 w/cm2, 1. Mhz    Ultrasound Goals Pain      Manual Therapy   Manual Therapy Soft tissue mobilization;Myofascial release;Muscle Energy Technique    Manual therapy comments all manual therapy performed for decreased pain and muscle tightness. pt reporting improvement afterwards.    Soft tissue mobilization along thoracic paraspinals and right subscapular area    Myofascial Release use of myofascial ball along bil thoracic parapinals for decreased muscle tightness; seated at edge of mat-                PT Short Term Goals - 12/24/20 1107      PT SHORT TERM GOAL #1   Title Pt will be IND with initial HEP in order to indicate reduced pain and improved function.  (Target Date: 01/23/21)    Time 4    Period Weeks    Status New    Target Date 01/23/21      PT SHORT TERM GOAL #2   Title Pt  will report no more than 8/10 pain with daily activities in order to indicate pain is dec limiting factor.    Time 4    Period Weeks    Status New      PT SHORT TERM GOAL #3   Title Pt will improve cervical rotation by 10 deg each direction in order to indicate more functional ROM and safety with driving.    Baseline 41 deg R, 42 deg L on eval    Time 4    Period Weeks    Status New      PT SHORT TERM GOAL #4   Title Pt will demonstrate improved body mechanics with lifting in order to reduce pain with work activities.    Time 4    Period Weeks    Status New      PT SHORT TERM GOAL #5   Title Will assess NDI and write LTG to reflect decreased neck disability with functional activities.    Time 4    Period Weeks    Status New             PT Long Term Goals - 12/24/20 1112      PT LONG TERM GOAL #1   Title Pt will be IND with final HEP in order to indicate dec pain and improve function. (Target Date: 02/22/21)    Time 8    Period Weeks    Status New    Target Date 02/22/21      PT LONG TERM GOAL #2   Title Pt will demonstrate safe overhead lifting of up to 30# in order to perform work duties more safely.    Time 8    Period Weeks    Status Revised      PT LONG TERM GOAL #3   Title Pt will safely lift/lower 40#s for work related activities with no more than 5/10 pain and safe body mechanics.    Time 8    Period Weeks    Status New      PT LONG TERM GOAL #4   Title Pt will improve cervical ROM to >/=60 deg bilaterally in order to improve function.    Time 8    Period Weeks    Status New                 Plan - 01/06/21 0808    Clinical Impression Statement Today's skilled session continued to address pain and muscle tightness  and began to address gentle strengthening. Pt reported less pain and tightness at end of session "it feels better". The pt is progressing toward goals and should benefit from continued PT to progress toward unmet goals.    Personal  Factors and Comorbidities Comorbidity 3+;Time since onset of injury/illness/exacerbation;Profession    Comorbidities see above    Examination-Activity Limitations Carry;Lift;Locomotion Level;Sit;Sleep;Stand;Reach Overhead    Examination-Participation Restrictions Occupation;Driving   travel   Stability/Clinical Decision Making Evolving/Moderate complexity    Rehab Potential Good    PT Frequency 2x / week    PT Duration 8 weeks    PT Treatment/Interventions ADLs/Self Care Home Management;Aquatic Therapy;Electrical Stimulation;Moist Heat;Traction;Ultrasound;Functional mobility training;Therapeutic activities;Therapeutic exercise;Neuromuscular re-education;Patient/family education;Manual techniques;Passive range of motion;Dry needling;Taping;Spinal Manipulations    PT Next Visit Plan Tranfer to Ortho (will be split over the next 2 weeks due to busy schedules with plan to fully transfer to [facility]). Need to have goal set for NDI (primary PT has not been in clinic to date to address). ? traction, dry needling, other modalities to address pain/tightness. Most pain is isolated to thoracic spine just below bra line- may need spinal mobs when on PT schedule. Continue with stretches and strengthening ex's. also needs body mechanics and safe lifting education due to nature of her work (does a lot of overhead lifting).    Consulted and Agree with Plan of Care Patient           Patient will benefit from skilled therapeutic intervention in order to improve the following deficits and impairments:  Decreased mobility,Decreased range of motion,Decreased strength,Hypomobility,Impaired flexibility,Impaired perceived functional ability,Impaired sensation,Impaired UE functional use,Postural dysfunction,Pain  Visit Diagnosis: Cervicalgia  Abnormal posture  Muscle weakness (generalized)  Pain in thoracic spine     Problem List Patient Active Problem List   Diagnosis Date Noted  . Bilateral low  back pain with sciatica 12/10/2020  . Pain in both feet 11/27/2020  . Chronic cholecystitis with calculus 08/17/2016    Sallyanne Kuster, PTA, [facility] [address] [place], [place] [address] [phone] 01/06/21, 2:24 PM   Name: Jasmine Mckenzie MRN: [id number] Date of Birth: 1981-05-26

## 2021-01-08 ENCOUNTER — Ambulatory Visit: Payer: 59 | Admitting: Physical Therapy

## 2021-01-09 ENCOUNTER — Other Ambulatory Visit: Payer: Self-pay

## 2021-01-09 ENCOUNTER — Ambulatory Visit: Payer: 59

## 2021-01-09 DIAGNOSIS — R293 Abnormal posture: Secondary | ICD-10-CM

## 2021-01-09 DIAGNOSIS — M542 Cervicalgia: Secondary | ICD-10-CM | POA: Diagnosis not present

## 2021-01-09 DIAGNOSIS — M546 Pain in thoracic spine: Secondary | ICD-10-CM

## 2021-01-09 DIAGNOSIS — M6281 Muscle weakness (generalized): Secondary | ICD-10-CM

## 2021-01-09 NOTE — Therapy (Signed)
[facility] [address] [place], [place], [address] Phone: [phone]   Fax:  [phone]  Physical Therapy Treatment  Patient Details  Name: Jasmine Mckenzie MRN: [id number] Date of Birth: Aug 15, 1981 Referring Provider (PT): Levert Feinstein, MD   Encounter Date: 01/09/2021   PT End of Session - 01/09/21 1028    Visit Number 5    Number of Visits 17    Date for PT Re-Evaluation 02/22/21    Authorization Type UHC    Authorization - Visit Number 4    Authorization - Number of Visits 60    PT Start Time 1020   pt arrived a few min late   PT Stop Time 1100    PT Time Calculation (min) 40 min    Activity Tolerance Patient tolerated treatment well    Behavior During Therapy [facility] for tasks assessed/performed           Past Medical History:  Diagnosis Date  . Arthritis   . Back pain   . Diabetes (HCC)   . GERD (gastroesophageal reflux disease)   . Heart palpitations   . IBS (irritable bowel syndrome)   . Idiopathic hypersomnia   . Neuropathy     Past Surgical History:  Procedure Laterality Date  . BREAST SURGERY     augmentation  . CHOLECYSTECTOMY N/A 08/17/2016   Procedure: LAPAROSCOPIC CHOLECYSTECTOMY WITH INTRAOPERATIVE CHOLANGIOGRAM;  Surgeon: Manus Rudd, MD;  Location: [facility] OR;  Service: General;  Laterality: N/A;  . UMBILICAL HERNIA REPAIR N/A 08/17/2016   Procedure: HERNIA REPAIR UMBILICAL ADULT;  Surgeon: Manus Rudd, MD;  Location: [facility] OR;  Service: General;  Laterality: N/A;    There were no vitals filed for this visit.   Subjective Assessment - 01/09/21 1022    Subjective Pt reports prolonged midback pain that occurs in prolonged sitting, standing, lying down. Only temporary benefits from PT so far. This has been present for at least a year. She was supposed to have an MRI of her back recently, but it was set to be her lower back and pt did not want to go forward with that since it's her upper back. She has  seen a chiropractor who told her she has a pinched nerve. She doesn't believe therapy is going to help.    Pertinent History Hx of low back pain, spinal    How long can you sit comfortably? 2-3 mins (sitting is worse)    How long can you stand comfortably? 20 mins or so    How long can you walk comfortably? 10-20 mins    Patient Stated Goals "I want to get out of bed without pain"    Currently in Pain? Yes    Pain Score 9     Pain Location Back    Pain Orientation Mid;Upper    Pain Descriptors / Indicators Aching    Pain Onset More than a month ago              [facility] PT Assessment - 01/09/21 0001      Posture/Postural Control   Postural Limitations Increased lumbar lordosis    Posture Comments mild increased forward shoulder rounding and forward head      AROM   Overall AROM Comments B thoracic rotation limited in sitting    AROM Assessment Site Lumbar    Lumbar Flexion limited 50% due to thoracic pain    Lumbar Extension [facility]    Lumbar - Right Side [facility]  Lumbar - Left Side [facility]    [facility]    [facility]                         [facility] Adult PT Treatment/Exercise - 01/09/21 0001      Exercises   Exercises Lumbar      Lumbar Exercises: Supine   Other Supine Lumbar Exercises FR T/S ext T3-4, 4-5, 5-6 x15 total   incr lumbar lordosis decr thoracic ext, verbal and tactile cues for neutral pelvis     Lumbar Exercises: Sidelying   Other Sidelying Lumbar Exercises Open books with hips and knees in 90/90 and HBH x10 B      Lumbar Exercises: Quadruped   Other Quadruped Lumbar Exercises Thread the needle with pink FR x10 B   some manual assisted rotation   Other Quadruped Lumbar Exercises partial child's pose with 3 pillows under forearms and ball in hip crease for deep breathing to facilitate posterior ribcage expansion      Manual Therapy   Manual Therapy Joint mobilization;Soft tissue mobilization;Myofascial  release;Passive ROM    Manual therapy comments prone    Joint Mobilization joint spring testing to thoracic spine: UPAs B T3-8, CPAs with some inferior gapping focus T3-8    Soft tissue mobilization STM to mid thoracic PS    Myofascial Release MFR between shoulder blades                  PT Education - 01/09/21 1238    Education Details Extensive education on importance on thoracic ribcage mobility, showed pt on skeleton where ribcage and thoracic vertebrae. Sent links from [employer] for foam rollers as option for pt, as she felt relief post FR. Edu for incr lumbar lordosis likely affecting ability to get true thoracic extension unless focusing on lumbopelvic position    Person(s) Educated Patient    Methods Explanation;Demonstration;Verbal cues;Tactile cues    Comprehension Verbalized understanding;Returned demonstration;Verbal cues required;Tactile cues required            PT Short Term Goals - 01/09/21 1507      PT SHORT TERM GOAL #1   Title Pt will be IND with initial HEP in order to indicate reduced pain and improved function.  (Target Date: 01/23/21)    Time 4    Period Weeks    Status New    Target Date 01/23/21      PT SHORT TERM GOAL #2   Title Pt will report no more than 8/10 pain with daily activities in order to indicate pain is dec limiting factor.    Time 4    Period Weeks    Status New      PT SHORT TERM GOAL #3   Title Pt will improve cervical rotation by 10 deg each direction in order to indicate more functional ROM and safety with driving.    Baseline 41 deg R, 42 deg L on eval    Time 4    Period Weeks    Status New      PT SHORT TERM GOAL #4   Title Pt will demonstrate improved body mechanics with lifting in order to reduce pain with work activities.    Time 4    Period Weeks    Status New      PT SHORT TERM GOAL #5   Title Will assess NDI and write LTG to reflect decreased neck disability with functional  activities.    Time 4    Period Weeks     Status Achieved             PT Long Term Goals - 01/09/21 1504      PT LONG TERM GOAL #1   Title Pt will be IND with final HEP in order to indicate dec pain and improve function. (Target Date: 02/22/21)    Time 8    Period Weeks    Status New      PT LONG TERM GOAL #2   Title Pt will demonstrate safe overhead lifting of up to 30# in order to perform work duties more safely.    Time 8    Period Weeks    Status Revised      PT LONG TERM GOAL #3   Title Pt will safely lift/lower 40#s for work related activities with no more than 5/10 pain and safe body mechanics.    Time 8    Period Weeks    Status New      PT LONG TERM GOAL #4   Title Pt will improve cervical ROM to >/=60 deg bilaterally in order to improve function.    Time 8    Period Weeks    Status New      PT LONG TERM GOAL #5   Title Pt will decrease NDI disability to <25%, indicating significant reduction in neck pain and affect on daily life.    Baseline 60% disability    Time 6    Period Weeks    Status New    Target Date 02/20/21                 Plan - 01/09/21 1029    Clinical Impression Statement Pt presents with continued 9/10 thoracic pain. She demonstrates a large lumbar lordosis in standing, as well as mild incr thoracic kyphosis, forward head and shoulder rounding. Pt educated on importance of thoracid mobility into extension and rotation with new mobilization focused exercises. She demonstrates decreased thoracic rotation and extension with more pain into extension in seated testing. She reported some relief following session and asked for info to buy a foam roller. She still wants a "quick fix", stating "this will take a long time and I need something sooner". Pt educated PT is hard work and takes consistency to see changes, but to stay the course and given ideas on how to incorporate exercises into work shift for longer relief.    Personal Factors and Comorbidities Comorbidity 3+;Time since  onset of injury/illness/exacerbation;Profession    Comorbidities see above    Examination-Activity Limitations Carry;Lift;Locomotion Level;Sit;Sleep;Stand;Reach Overhead    Examination-Participation Restrictions Occupation;Driving   travel   Stability/Clinical Decision Making Evolving/Moderate complexity    Rehab Potential Good    PT Frequency 2x / week    PT Duration 8 weeks    PT Treatment/Interventions ADLs/Self Care Home Management;Aquatic Therapy;Electrical Stimulation;Moist Heat;Traction;Ultrasound;Functional mobility training;Therapeutic activities;Therapeutic exercise;Neuromuscular re-education;Patient/family education;Manual techniques;Passive range of motion;Dry needling;Taping;Spinal Manipulations    PT Next Visit Plan Tranfer to Ortho (will be split over the next 2 weeks due to busy schedules with plan to fully transfer to [facility] clinic). traction?, dry needling, other modalities to address pain/tightness. Spinal mobility including thoracic extension. rotation, manual therapy. Continue with stretches and strengthening ex's. also needs body mechanics and safe lifting education due to nature of her work (does a lot of overhead lifting). Manual therapy to address thoracic spine restrictions and hypomobility, focus on thoracic mobility, lumbopelvic  alignment and rhythm, chest and ribcage expansion, periscapular and core strengthening.    Consulted and Agree with Plan of Care Patient           Patient will benefit from skilled therapeutic intervention in order to improve the following deficits and impairments:  Decreased mobility,Decreased range of motion,Decreased strength,Hypomobility,Impaired flexibility,Impaired perceived functional ability,Impaired sensation,Impaired UE functional use,Postural dysfunction,Pain  Visit Diagnosis: Cervicalgia  Abnormal posture  Muscle weakness (generalized)  Pain in thoracic spine     Problem List Patient Active Problem List   Diagnosis  Date Noted  . Bilateral low back pain with sciatica 12/10/2020  . Pain in both feet 11/27/2020  . Chronic cholecystitis with calculus 08/17/2016    Marcelline Mates, PT, DPT 01/09/2021, 3:07 PM  [facility] [address] [place], [place], [address] Phone: [phone]   Fax:  [phone]  Name: Carolene Gitto MRN: [id number] Date of Birth: [birth year]

## 2021-01-13 ENCOUNTER — Ambulatory Visit: Payer: 59

## 2021-01-13 ENCOUNTER — Other Ambulatory Visit: Payer: Self-pay

## 2021-01-13 DIAGNOSIS — M542 Cervicalgia: Secondary | ICD-10-CM | POA: Diagnosis not present

## 2021-01-13 DIAGNOSIS — M6281 Muscle weakness (generalized): Secondary | ICD-10-CM

## 2021-01-14 NOTE — Therapy (Signed)
[facility] [address] [place], [place], [address] Phone: [phone]   Fax:  [phone]  Physical Therapy Treatment  Patient Details  Name: Jasmine Mckenzie MRN: [id number] Date of Birth: 1981-05-16 Referring Provider (PT): Levert Feinstein, MD   Encounter Date: 01/13/2021   PT End of Session - 01/14/21 1037    Visit Number 6    Number of Visits 17    Date for PT Re-Evaluation 02/22/21    Authorization Type UHC    Authorization - Visit Number 4    Authorization - Number of Visits 60    PT Start Time 1745    PT Stop Time 1830    PT Time Calculation (min) 45 min    Activity Tolerance Patient tolerated treatment well    Behavior During Therapy [facility] for tasks assessed/performed           Past Medical History:  Diagnosis Date  . Arthritis   . Back pain   . Diabetes (HCC)   . GERD (gastroesophageal reflux disease)   . Heart palpitations   . IBS (irritable bowel syndrome)   . Idiopathic hypersomnia   . Neuropathy     Past Surgical History:  Procedure Laterality Date  . BREAST SURGERY     augmentation  . CHOLECYSTECTOMY N/A 08/17/2016   Procedure: LAPAROSCOPIC CHOLECYSTECTOMY WITH INTRAOPERATIVE CHOLANGIOGRAM;  Surgeon: Manus Rudd, MD;  Location: [facility] OR;  Service: General;  Laterality: N/A;  . UMBILICAL HERNIA REPAIR N/A 08/17/2016   Procedure: HERNIA REPAIR UMBILICAL ADULT;  Surgeon: Manus Rudd, MD;  Location: [facility] OR;  Service: General;  Laterality: N/A;    There were no vitals filed for this visit.   Subjective Assessment - 01/14/21 1027    Subjective Continues to note lumbosacral, upper back and intra scapula pain/discomfort rated at 8/10 at start of session, awarded temporary relief of pain following therapy sessions, still pursuing MRI options through her physicians,   Unable to identify specific aggravating factors    Pertinent History Hx of low back pain, spinal    How long can you sit comfortably?  2-3 mins (sitting is worse)    How long can you stand comfortably? 20 mins or so    How long can you walk comfortably? 10-20 mins    Patient Stated Goals "I want to get out of bed without pain"    Currently in Pain? Yes    Pain Score 8     Pain Location Back    Pain Orientation Upper;Lower    Pain Descriptors / Indicators Aching    Pain Type Chronic pain    Pain Radiating Towards R deltoid    Pain Onset More than a month ago    Pain Frequency Constant    Aggravating Factors  nothing specific    Pain Relieving Factors nothing specific                             [facility] Adult PT Treatment/Exercise - 01/14/21 0001      Ultrasound   Ultrasound Location focused on levator scapulae    Ultrasound Parameters 10 min, 100%, 1.5 w/cm2, 1.71mhz duty cycle      Manual Therapy   Manual Therapy Joint mobilization;Soft tissue mobilization;Myofascial release    Manual therapy comments prone over prone pillow    Joint Mobilization in sitting performed MWMs into thoracic extension at levels 2-8, 3-5 reps at ea. level followed by MWM into R  rotation at levels 5-7.    Myofascial Release MFR to lumbosacral and upper back region in prone over prone pillow                  PT Education - 01/14/21 1036    Education Details Asked to withold HEP until nxt PT session 3/17 to assess effects of manual intervention    Person(s) Educated Patient    Methods Explanation    Comprehension Verbalized understanding            PT Short Term Goals - 01/09/21 1507      PT SHORT TERM GOAL #1   Title Pt will be IND with initial HEP in order to indicate reduced pain and improved function.  (Target Date: 01/23/21)    Time 4    Period Weeks    Status New    Target Date 01/23/21      PT SHORT TERM GOAL #2   Title Pt will report no more than 8/10 pain with daily activities in order to indicate pain is dec limiting factor.    Time 4    Period Weeks    Status New      PT SHORT TERM GOAL  #3   Title Pt will improve cervical rotation by 10 deg each direction in order to indicate more functional ROM and safety with driving.    Baseline 41 deg R, 42 deg L on eval    Time 4    Period Weeks    Status New      PT SHORT TERM GOAL #4   Title Pt will demonstrate improved body mechanics with lifting in order to reduce pain with work activities.    Time 4    Period Weeks    Status New      PT SHORT TERM GOAL #5   Title Will assess NDI and write LTG to reflect decreased neck disability with functional activities.    Time 4    Period Weeks    Status Achieved             PT Long Term Goals - 01/09/21 1504      PT LONG TERM GOAL #1   Title Pt will be IND with final HEP in order to indicate dec pain and improve function. (Target Date: 02/22/21)    Time 8    Period Weeks    Status New      PT LONG TERM GOAL #2   Title Pt will demonstrate safe overhead lifting of up to 30# in order to perform work duties more safely.    Time 8    Period Weeks    Status Revised      PT LONG TERM GOAL #3   Title Pt will safely lift/lower 40#s for work related activities with no more than 5/10 pain and safe body mechanics.    Time 8    Period Weeks    Status New      PT LONG TERM GOAL #4   Title Pt will improve cervical ROM to >/=60 deg bilaterally in order to improve function.    Time 8    Period Weeks    Status New      PT LONG TERM GOAL #5   Title Pt will decrease NDI disability to <25%, indicating significant reduction in neck pain and affect on daily life.    Baseline 60% disability    Time 6    Period Weeks    Status  New    Target Date 02/20/21                 Plan - 01/13/21 1038    Clinical Impression Statement Todays skilled session consisted of [place] to B levator scapulae, myofascial release techniques to upper back and lumbosacral regions followed by MWMs(mobilization with movement) to T-spine 2-8 into extension and R rotation; following intervention patient  reported a decrease in pain and both cervical and thoracic mobility appeared functional (despite c/o 8/10 pain at start of session).  Myofascial restriction evident through treatment    Personal Factors and Comorbidities Comorbidity 3+;Time since onset of injury/illness/exacerbation;Profession    Comorbidities see above    Examination-Activity Limitations Carry;Lift;Locomotion Level;Sit;Sleep;Stand;Reach Overhead    Examination-Participation Restrictions Occupation;Driving   travel   Stability/Clinical Decision Making Evolving/Moderate complexity    Rehab Potential Good    PT Frequency 2x / week    PT Duration 8 weeks    PT Treatment/Interventions ADLs/Self Care Home Management;Aquatic Therapy;Electrical Stimulation;Moist Heat;Traction;Ultrasound;Functional mobility training;Therapeutic activities;Therapeutic exercise;Neuromuscular re-education;Patient/family education;Manual techniques;Passive range of motion;Dry needling;Taping;Spinal Manipulations    PT Next Visit Plan Assess effects of last intervention consisting of MFR, [place] and MWMs to t-spine into extension and R rotation, f/u with body mechanics training, postural corrections, upper back strengthening body mechanics    Consulted and Agree with Plan of Care Patient           Patient will benefit from skilled therapeutic intervention in order to improve the following deficits and impairments:  Decreased mobility,Decreased range of motion,Decreased strength,Hypomobility,Impaired flexibility,Impaired perceived functional ability,Impaired sensation,Impaired UE functional use,Postural dysfunction,Pain  Visit Diagnosis: Cervicalgia  Muscle weakness (generalized)     Problem List Patient Active Problem List   Diagnosis Date Noted  . Bilateral low back pain with sciatica 12/10/2020  . Pain in both feet 11/27/2020  . Chronic cholecystitis with calculus 08/17/2016    Farris Has Dorien Bessent PT 01/14/2021, 11:07 AM  [facility] [address] [place], [place], [address] Phone: [phone]   Fax:  [phone]  Name: Jasmine Mckenzie MRN: [id number] Date of Birth: 1981/01/30

## 2021-01-15 ENCOUNTER — Ambulatory Visit: Payer: 59 | Admitting: Physical Therapy

## 2021-01-15 ENCOUNTER — Ambulatory Visit
Admission: RE | Admit: 2021-01-15 | Discharge: 2021-01-15 | Disposition: A | Discharge status: Home / Self Care | Payer: 59 | Source: Ambulatory Visit | Attending: Internal Medicine | Admitting: Internal Medicine

## 2021-01-15 ENCOUNTER — Other Ambulatory Visit: Payer: Self-pay | Admitting: Internal Medicine

## 2021-01-15 DIAGNOSIS — M545 Low back pain, unspecified: Secondary | ICD-10-CM

## 2021-01-19 ENCOUNTER — Other Ambulatory Visit: Payer: Self-pay

## 2021-01-19 ENCOUNTER — Ambulatory Visit: Payer: 59

## 2021-01-19 DIAGNOSIS — M542 Cervicalgia: Secondary | ICD-10-CM

## 2021-01-19 DIAGNOSIS — M6281 Muscle weakness (generalized): Secondary | ICD-10-CM

## 2021-01-19 DIAGNOSIS — R293 Abnormal posture: Secondary | ICD-10-CM

## 2021-01-19 DIAGNOSIS — M546 Pain in thoracic spine: Secondary | ICD-10-CM

## 2021-01-19 NOTE — Therapy (Signed)
[facility] [address] [place], [place], [address] Phone: [phone]   Fax:  [phone]  Physical Therapy Treatment  Patient Details  Name: Jasmine Mckenzie MRN: [id number] Date of Birth: 10-08-1981 Referring Provider (PT): Levert Feinstein, MD   Encounter Date: 01/19/2021   PT End of Session - 01/19/21 0835    Visit Number 7    Number of Visits 17    Date for PT Re-Evaluation 02/22/21    Authorization Type UHC    Authorization - Visit Number 5    Authorization - Number of Visits 60    PT Start Time [phone]   pt arrived late   PT Stop Time 0914    PT Time Calculation (min) 39 min    Activity Tolerance Patient tolerated treatment well    Behavior During Therapy [facility] for tasks assessed/performed           Past Medical History:  Diagnosis Date  . Arthritis   . Back pain   . Diabetes (HCC)   . GERD (gastroesophageal reflux disease)   . Heart palpitations   . IBS (irritable bowel syndrome)   . Idiopathic hypersomnia   . Neuropathy     Past Surgical History:  Procedure Laterality Date  . BREAST SURGERY     augmentation  . CHOLECYSTECTOMY N/A 08/17/2016   Procedure: LAPAROSCOPIC CHOLECYSTECTOMY WITH INTRAOPERATIVE CHOLANGIOGRAM;  Surgeon: Manus Rudd, MD;  Location: [facility] OR;  Service: General;  Laterality: N/A;  . UMBILICAL HERNIA REPAIR N/A 08/17/2016   Procedure: HERNIA REPAIR UMBILICAL ADULT;  Surgeon: Manus Rudd, MD;  Location: [facility] OR;  Service: General;  Laterality: N/A;    There were no vitals filed for this visit.   Subjective Assessment - 01/19/21 0834    Subjective "My upper back pain is so bad, I can't even really tell when I'm having pain in my low back. I saw the doctor and got started on medicine for fibromyalgia, but I have only been taking it 3 days so far so not long enough to tell yet. I was real sore after my last session. I think they did a good job, but I feel like we overworked it with the  exercises."    Pertinent History Hx of low back pain, spinal    How long can you sit comfortably? 2-3 mins (sitting is worse)    How long can you stand comfortably? 20 mins or so    How long can you walk comfortably? 10-20 mins    Patient Stated Goals "I want to get out of bed without pain"    Currently in Pain? Yes    Pain Score 8     Pain Location Back    Pain Orientation Upper;Lower    Pain Descriptors / Indicators Aching    Pain Onset More than a month ago              [facility] PT Assessment - 01/19/21 0001      Assessment   Medical Diagnosis mid and upper back pain    Referring Provider (PT) Levert Feinstein, MD                         [facility] Adult PT Treatment/Exercise - 01/19/21 0001      Self-Care   Self-Care Other Self-Care Comments    Other Self-Care Comments  See patient education      Lumbar Exercises: Aerobic   UBE (Upper Arm Bike) L1 x 4  min (2 min forward, 2 min backward)      Lumbar Exercises: Standing   Other Standing Lumbar Exercises hip hinge with dowel x 20    Other Standing Lumbar Exercises serratus wall slide with pool noodle x 15      Lumbar Exercises: Seated   Other Seated Lumbar Exercises seated cervical retraction with cues for upright posture/scapular retraction x 20      Lumbar Exercises: Supine   Pelvic Tilt 20 reps    Pelvic Tilt Limitations initial tactile and verbal cues for technique and breathing    Other Supine Lumbar Exercises serratus punch with 3# on dowel x 15    Other Supine Lumbar Exercises bilateral shoulder horizontal ABD x 20 with red band; bilateral shoulder ER x 20 with red band      Lumbar Exercises: Sidelying   Other Sidelying Lumbar Exercises open books 10x each direction with hip/knee flexion and 2-3 sec holds at end range rotation      Lumbar Exercises: Quadruped   Madcat/Old Horse 15 reps    Madcat/Old Horse Limitations with thoracic bias    Other Quadruped Lumbar Exercises thread the needle x 10 each  direction with cues for core activation; limited thoracic rotation noted bilaterally                  PT Education - 01/19/21 1224    Education Details Reiterated importance of thoracic mobility and discussed consistency with HEP to maximize benefit over time - reviewed 6-8 weeks of consisteny for functional strengthening as patient asked if it normal to feel sore with exercises.    Person(s) Educated Patient    Methods Explanation;Demonstration;Tactile cues;Verbal cues    Comprehension Verbalized understanding;Returned demonstration            PT Short Term Goals - 01/19/21 1240      PT SHORT TERM GOAL #1   Title Pt will be IND with initial HEP in order to indicate reduced pain and improved function.  (Target Date: 01/23/21)    Baseline Has not been issued HEP at this time - transferred from neuro rehab    Time 4    Period Weeks    Status On-going    Target Date 01/23/21      PT SHORT TERM GOAL #2   Title Pt will report no more than 8/10 pain with daily activities in order to indicate pain is dec limiting factor.    Baseline continues to report 8/10 at rest that increases with activity    Time 4    Period Weeks    Status On-going      PT SHORT TERM GOAL #3   Title Pt will improve cervical rotation by 10 deg each direction in order to indicate more functional ROM and safety with driving.    Baseline 41 deg R, 42 deg L on eval    Time 4    Period Weeks    Status On-going      PT SHORT TERM GOAL #4   Title Pt will demonstrate improved body mechanics with lifting in order to reduce pain with work activities.    Time 4    Period Weeks    Status On-going      PT SHORT TERM GOAL #5   Title Will assess NDI and write LTG to reflect decreased neck disability with functional activities.    Time 4    Period Weeks    Status Achieved  PT Long Term Goals - 01/09/21 1504      PT LONG TERM GOAL #1   Title Pt will be IND with final HEP in order to indicate  dec pain and improve function. (Target Date: 02/22/21)    Time 8    Period Weeks    Status New      PT LONG TERM GOAL #2   Title Pt will demonstrate safe overhead lifting of up to 30# in order to perform work duties more safely.    Time 8    Period Weeks    Status Revised      PT LONG TERM GOAL #3   Title Pt will safely lift/lower 40#s for work related activities with no more than 5/10 pain and safe body mechanics.    Time 8    Period Weeks    Status New      PT LONG TERM GOAL #4   Title Pt will improve cervical ROM to >/=60 deg bilaterally in order to improve function.    Time 8    Period Weeks    Status New      PT LONG TERM GOAL #5   Title Pt will decrease NDI disability to <25%, indicating significant reduction in neck pain and affect on daily life.    Baseline 60% disability    Time 6    Period Weeks    Status New    Target Date 02/20/21                 Plan - 01/19/21 0835    Clinical Impression Statement Patient presents with continued 8/10 upper/mid back pain. She performed all interventions without adverse effects or complaints of increased pain. At end of session, she expresses that she does not feel that exercises are helping but inquired about massage and modalities. Discussed that functional strengthening takes time and consistency and encouraged patient not to feel discouraged because she is not noticing "instant" results as the interventions will allow for improved function and provide lasting relief if performed regularly over time. Discussed that modalities and soft tissue work is beneficial for pain relief but as tools to allow for greater tolerance with functional tasks/activities and not necessarily as a permanent solution. Pt verbalized understanding. She should benefit from continued skilled PT to increase functional mobility, reduce pain, and allow for improved tolerance with daily tasks.    Personal Factors and Comorbidities Comorbidity 3+;Time since  onset of injury/illness/exacerbation;Profession    Comorbidities see above    Examination-Activity Limitations Carry;Lift;Locomotion Level;Sit;Sleep;Stand;Reach Overhead    Examination-Participation Restrictions Occupation;Driving   travel   Stability/Clinical Decision Making Evolving/Moderate complexity    Rehab Potential Good    PT Frequency 2x / week    PT Duration 8 weeks    PT Treatment/Interventions ADLs/Self Care Home Management;Aquatic Therapy;Electrical Stimulation;Moist Heat;Traction;Ultrasound;Functional mobility training;Therapeutic activities;Therapeutic exercise;Neuromuscular re-education;Patient/family education;Manual techniques;Passive range of motion;Dry needling;Taping;Spinal Manipulations    PT Next Visit Plan Continue improving thoracic mobility, manual techniques PRN (thoracic spine mobilizations), body mechanics training (especially overhead lifting for work tasks), postural corrections, upper back strengthening    PT Home Exercise Plan Provide HEP handout next session pending response to interventions this session    Consulted and Agree with Plan of Care Patient           Patient will benefit from skilled therapeutic intervention in order to improve the following deficits and impairments:  Decreased mobility,Decreased range of motion,Decreased strength,Hypomobility,Impaired flexibility,Impaired perceived functional ability,Impaired sensation,Impaired UE functional use,Postural dysfunction,Pain  Visit Diagnosis: Cervicalgia  Muscle weakness (generalized)  Abnormal posture  Pain in thoracic spine     Problem List Patient Active Problem List   Diagnosis Date Noted  . Bilateral low back pain with sciatica 12/10/2020  . Pain in both feet 11/27/2020  . Chronic cholecystitis with calculus 08/17/2016     Jasmine Mckenzie, PT, DPT 01/19/21 12:44 PM  [facility] [address] [place], [place], [address] Phone:  [phone]   Fax:  [phone]  Name: Jasmine Mckenzie MRN: [id number] Date of Birth: [birth year]

## 2021-01-20 ENCOUNTER — Ambulatory Visit: Payer: 59 | Admitting: Physical Therapy

## 2021-01-22 ENCOUNTER — Ambulatory Visit: Payer: 59 | Admitting: Physical Therapy

## 2021-01-27 ENCOUNTER — Ambulatory Visit: Payer: 59 | Admitting: Physical Therapy

## 2021-01-27 ENCOUNTER — Ambulatory Visit: Payer: 59

## 2021-01-29 ENCOUNTER — Ambulatory Visit: Payer: 59 | Admitting: Physical Therapy

## 2021-01-29 ENCOUNTER — Ambulatory Visit: Payer: 59

## 2021-01-29 ENCOUNTER — Other Ambulatory Visit: Payer: Self-pay

## 2021-01-29 DIAGNOSIS — M546 Pain in thoracic spine: Secondary | ICD-10-CM

## 2021-01-29 DIAGNOSIS — R293 Abnormal posture: Secondary | ICD-10-CM

## 2021-01-29 DIAGNOSIS — M542 Cervicalgia: Secondary | ICD-10-CM

## 2021-01-29 DIAGNOSIS — M6281 Muscle weakness (generalized): Secondary | ICD-10-CM

## 2021-01-29 NOTE — Therapy (Addendum)
[place], [place], [address] Phone: [phone]   Fax:  [phone]  Physical Therapy Treatment / Discharge  Patient Details  Name: Jasmine Mckenzie MRN: [id number] Date of Birth: [birth year] Referring Provider (PT): Marcial Pacas, MD   Encounter Date: 01/29/2021   PT End of Session - 01/29/21 0909    Visit Number 7    Number of Visits 17    Date for PT Re-Evaluation 02/22/21    Authorization Type UHC    Authorization - Visit Number 5    Authorization - Number of Visits 23    PT Start Time 0850    PT Stop Time 0905    PT Time Calculation (min) 15 min    Activity Tolerance Patient tolerated treatment well    Behavior During Therapy [facility] for tasks assessed/performed           Past Medical History:  Diagnosis Date  . Arthritis   . Back pain   . Diabetes ([place])   . GERD (gastroesophageal reflux disease)   . Heart palpitations   . IBS (irritable bowel syndrome)   . Idiopathic hypersomnia   . Neuropathy     Past Surgical History:  Procedure Laterality Date  . BREAST SURGERY     augmentation  . CHOLECYSTECTOMY N/A 08/17/2016   Procedure: LAPAROSCOPIC CHOLECYSTECTOMY WITH INTRAOPERATIVE CHOLANGIOGRAM;  Surgeon: Donnie Mesa, MD;  Location: [place];  Service: General;  Laterality: N/A;  . UMBILICAL HERNIA REPAIR N/A 08/17/2016   Procedure: HERNIA REPAIR UMBILICAL ADULT;  Surgeon: Donnie Mesa, MD;  Location: [place];  Service: General;  Laterality: N/A;    There were no vitals filed for this visit.   Subjective Assessment - 01/29/21 0853    Subjective "I had a back, stiff neck over the weekend and I couldn't get out of bed. I used to get stiff neck as a kid, but not really since. I don't see a change in my pain since she started her Fibromyalgia medicine." She sees her PCP at 11am. she wanted to cancel today, but didn't think she could without paying a late fee. She's been getting worse and is confused.     Pertinent History Hx of low back pain, spinal    How long can you sit comfortably? 2-3 mins (sitting is worse)    How long can you stand comfortably? 20 mins or so    How long can you walk comfortably? 10-20 mins    Patient Stated Goals "I want to get out of bed without pain"    Currently in Pain? Yes    Pain Score 10-Worst pain ever    Pain Location Back    Pain Orientation Upper    Pain Onset More than a month ago                                       PT Short Term Goals - 01/19/21 1240      PT SHORT TERM GOAL #1   Title Pt will be IND with initial HEP in order to indicate reduced pain and improved function.  (Target Date: 01/23/21)    Baseline Has not been issued HEP at this time - transferred from neuro rehab    Time 4    Period Weeks    Status On-going    Target Date 01/23/21      PT SHORT TERM GOAL #  2   Title Pt will report no more than 8/10 pain with daily activities in order to indicate pain is dec limiting factor.    Baseline continues to report 8/10 at rest that increases with activity    Time 4    Period Weeks    Status On-going      PT SHORT TERM GOAL #3   Title Pt will improve cervical rotation by 10 deg each direction in order to indicate more functional ROM and safety with driving.    Baseline 41 deg R, 42 deg L on eval    Time 4    Period Weeks    Status On-going      PT SHORT TERM GOAL #4   Title Pt will demonstrate improved body mechanics with lifting in order to reduce pain with work activities.    Time 4    Period Weeks    Status On-going      PT SHORT TERM GOAL #5   Title Will assess NDI and write LTG to reflect decreased neck disability with functional activities.    Time 4    Period Weeks    Status Achieved             PT Long Term Goals - 01/09/21 1504      PT LONG TERM GOAL #1   Title Pt will be IND with final HEP in order to indicate dec pain and improve function. (Target Date: 02/22/21)    Time 8     Period Weeks    Status New      PT LONG TERM GOAL #2   Title Pt will demonstrate safe overhead lifting of up to 30# in order to perform work duties more safely.    Time 8    Period Weeks    Status Revised      PT LONG TERM GOAL #3   Title Pt will safely lift/lower 40#s for work related activities with no more than 5/10 pain and safe body mechanics.    Time 8    Period Weeks    Status New      PT LONG TERM GOAL #4   Title Pt will improve cervical ROM to >/=60 deg bilaterally in order to improve function.    Time 8    Period Weeks    Status New      PT LONG TERM GOAL #5   Title Pt will decrease NDI disability to <25%, indicating significant reduction in neck pain and affect on daily life.    Baseline 60% disability    Time 6    Period Weeks    Status New    Target Date 02/20/21                 Plan - 01/29/21 0902    Clinical Impression Statement Pt presents with 10/10 pain with report of feeling worse and worse. Her medication has not helped her yet, and she just got off work and had not slept yet. Pt was advised we did not have to go through with today's session, due to extreme pain levels and not feeling anyimprovement with PT. Canceled next 2 visits, and pt follows up with primary care today at 11am.    Personal Factors and Comorbidities Comorbidity 3+;Time since onset of injury/illness/exacerbation;Profession    Comorbidities see above    Examination-Activity Limitations Carry;Lift;Locomotion Level;Sit;Sleep;Stand;Reach Overhead    Examination-Participation Restrictions Occupation;Driving   travel   Stability/Clinical Decision Making Evolving/Moderate complexity  Rehab Potential Good    PT Frequency 2x / week    PT Duration 8 weeks    PT Treatment/Interventions ADLs/Self Care Home Management;Aquatic Therapy;Electrical Stimulation;Moist Heat;Traction;Ultrasound;Functional mobility training;Therapeutic activities;Therapeutic exercise;Neuromuscular  re-education;Patient/family education;Manual techniques;Passive range of motion;Dry needling;Taping;Spinal Manipulations    PT Next Visit Plan Hold next 2 visits. Continue improving thoracic mobility, manual techniques PRN (thoracic spine mobilizations), body mechanics training (especially overhead lifting for work tasks), postural corrections, upper back strengthening    PT Home Exercise Plan Provide HEP handout next session pending response to interventions this session    Consulted and Agree with Plan of Care Patient           Patient will benefit from skilled therapeutic intervention in order to improve the following deficits and impairments:  Decreased mobility,Decreased range of motion,Decreased strength,Hypomobility,Impaired flexibility,Impaired perceived functional ability,Impaired sensation,Impaired UE functional use,Postural dysfunction,Pain  Visit Diagnosis: Cervicalgia  Muscle weakness (generalized)  Abnormal posture  Pain in thoracic spine     Problem List Patient Active Problem List   Diagnosis Date Noted  . Bilateral low back pain with sciatica 12/10/2020  . Pain in both feet 11/27/2020  . Chronic cholecystitis with calculus 08/17/2016    Izell Bardwell, PT, DPT 01/29/2021, 9:33 AM  [facility] [address] [place], [place], [address] Phone: [phone]   Fax:  [phone]  Name: Jasmine Mckenzie MRN: [id number] Date of Birth: [birth year]      PHYSICAL THERAPY DISCHARGE SUMMARY  Visits from Start of Care: 7  Current functional level related to goals / functional outcomes: See goals   Remaining deficits: Pt called cancelling all visits stating she didn't need PT and self discharged.    Education / Equipment: HEP  Plan: Patient agrees to discharge.  Patient goals were partially met. Patient is being discharged due to the patient's request.  ?????         Kristoffer Leamon PT, DPT, LAT,  ATC  02/09/21  1:13 PM

## 2021-02-03 ENCOUNTER — Ambulatory Visit: Payer: 59

## 2021-02-05 ENCOUNTER — Ambulatory Visit: Payer: 59 | Admitting: Physical Therapy

## 2021-02-10 ENCOUNTER — Ambulatory Visit: Payer: 59

## 2021-02-10 ENCOUNTER — Ambulatory Visit: Payer: 59 | Admitting: Physical Therapy

## 2021-02-12 ENCOUNTER — Ambulatory Visit: Payer: 59 | Admitting: Physical Therapy

## 2021-02-16 ENCOUNTER — Ambulatory Visit: Payer: 59

## 2021-02-18 ENCOUNTER — Ambulatory Visit: Payer: 59

## 2021-02-19 ENCOUNTER — Encounter: Payer: 59 | Admitting: Physical Therapy

## 2021-02-26 ENCOUNTER — Encounter: Payer: 59 | Admitting: Physical Therapy

## 2021-03-03 ENCOUNTER — Encounter: Payer: 59 | Admitting: Physical Therapy

## 2021-03-05 ENCOUNTER — Encounter: Payer: 59 | Admitting: Physical Therapy

## 2021-03-10 ENCOUNTER — Encounter: Payer: 59 | Admitting: Physical Therapy

## 2021-05-13 ENCOUNTER — Other Ambulatory Visit: Payer: Self-pay

## 2021-05-13 ENCOUNTER — Encounter (HOSPITAL_COMMUNITY): Payer: Self-pay

## 2021-05-13 ENCOUNTER — Ambulatory Visit (HOSPITAL_COMMUNITY)
Admission: EM | Admit: 2021-05-13 | Discharge: 2021-05-13 | Disposition: A | Discharge status: Home / Self Care | Payer: 59 | Attending: Emergency Medicine | Admitting: Emergency Medicine

## 2021-05-13 DIAGNOSIS — J029 Acute pharyngitis, unspecified: Secondary | ICD-10-CM

## 2021-05-13 DIAGNOSIS — R059 Cough, unspecified: Secondary | ICD-10-CM | POA: Diagnosis not present

## 2021-05-13 LAB — POCT RAPID STREP A, ED / UC: Streptococcus, Group A Screen (Direct): NEGATIVE

## 2021-05-13 MED ORDER — PROMETHAZINE-DM 6.25-15 MG/5ML PO SYRP: 5.0000 mL | ORAL_SOLUTION | Freq: Four times a day (QID) | ORAL | 0 refills | Status: AC | PRN

## 2021-05-13 MED ORDER — BENZONATATE 100 MG PO CAPS: 200.0000 mg | ORAL_CAPSULE | Freq: Three times a day (TID) | ORAL | 0 refills | Status: AC | PRN

## 2021-05-13 NOTE — Discharge Instructions (Addendum)
You can take Tylenol and/or Ibuprofen as needed for fever reduction and pain relief.   You can take the Tessalon perles as needed for cough.  You can take the Promethazine DM as needed for cough at night as it can make you sleepy.   For cough: honey 1/2 to 1 teaspoon (you can dilute the honey in water or another fluid).  You can also use guaifenesin and dextromethorphan for cough. You can use a humidifier for chest congestion and cough.  If you don't have a humidifier, you can sit in the bathroom with the hot shower running.     For sore throat: try warm salt water gargles, cepacol lozenges, throat spray, warm tea or water with lemon/honey, popsicles or ice, or OTC cold relief medicine for throat discomfort.    For congestion: take a daily anti-histamine like Zyrtec, Claritin, and a oral decongestant, such as pseudoephedrine.  You can also use Flonase 1-2 sprays in each nostril daily.    It is important to stay hydrated: drink plenty of fluids (water, gatorade/powerade/pedialyte, juices, or teas) to keep your throat moisturized and help further relieve irritation/discomfort.   Return or go to the Emergency Department if symptoms worsen or do not improve in the next few days.

## 2021-05-13 NOTE — ED Provider Notes (Signed)
[facility]    CSN: [id number] Arrival date & time: 05/13/21  1458      History   Chief Complaint Chief Complaint  Patient presents with   Cough   Sore Throat   Headache   Otalgia    HPI Jasmine Mckenzie is a 40 y.o. female.   Patient here for evaluation of cough, headache, sore throat, ear pain, and generalized body aches that have been ongoing for the past week.  Reports history of COVID approximately 1 month ago.  Reports taking OTC medications with minimal relief.  Unsure if any recent sick contacts.  Denies any trauma, injury, or other precipitating event.  Denies any specific alleviating or aggravating factors.  Denies any fevers, chest pain, shortness of breath, N/V/D, numbness, tingling, weakness, abdominal pain.     The history is provided by the patient.  Cough Associated symptoms: ear pain, headaches and sore throat   Sore Throat Associated symptoms include headaches.  Headache Associated symptoms: cough, ear pain, fatigue and sore throat   Otalgia Associated symptoms: cough, headaches and sore throat    Past Medical History:  Diagnosis Date   Arthritis    Back pain    Diabetes (HCC)    GERD (gastroesophageal reflux disease)    Heart palpitations    IBS (irritable bowel syndrome)    Idiopathic hypersomnia    Neuropathy     Patient Active Problem List   Diagnosis Date Noted   Bilateral low back pain with sciatica 12/10/2020   Pain in both feet 11/27/2020   Chronic cholecystitis with calculus 08/17/2016    Past Surgical History:  Procedure Laterality Date   BREAST SURGERY     augmentation   CHOLECYSTECTOMY N/A 08/17/2016   Procedure: LAPAROSCOPIC CHOLECYSTECTOMY WITH INTRAOPERATIVE CHOLANGIOGRAM;  Surgeon: Manus Rudd, MD;  Location: [facility] OR;  Service: General;  Laterality: N/A;   UMBILICAL HERNIA REPAIR N/A 08/17/2016   Procedure: HERNIA REPAIR UMBILICAL ADULT;  Surgeon: Manus Rudd, MD;  Location: [facility];  Service: General;   Laterality: N/A;    OB History     Gravida  5   Para  4   Term  4   Preterm      AB  1   Living  4      SAB      IAB      Ectopic  1   Multiple      Live Births           Obstetric Comments  Ectopic in 2019 tx with MTX          Home Medications    Prior to Admission medications   Medication Sig Start Date End Date Taking? Authorizing Provider  benzonatate (TESSALON PERLES) 100 MG capsule Take 2 capsules (200 mg total) by mouth 3 (three) times daily as needed for cough. 05/13/21  Yes Ivette Loyal, NP  promethazine-dextromethorphan (PROMETHAZINE-DM) 6.25-15 MG/5ML syrup Take 5 mLs by mouth 4 (four) times daily as needed for cough. 05/13/21  Yes Ivette Loyal, NP  acetaminophen (TYLENOL) 325 MG tablet Take 650 mg by mouth as needed.    [provider]  celecoxib (CELEBREX) 200 MG capsule Take 200 mg by mouth daily. 08/19/20   [provider]  MELATONIN GUMMIES PO Take 10 mg by mouth at bedtime as needed.    [provider]  methylphenidate (RITALIN) 10 MG tablet Take 15 mg by mouth 2 (two) times daily.    [provider]  zaleplon (SONATA) 5 MG capsule Take 5 mg by mouth at bedtime as needed. 06/30/20   [provider]    Family History Family History  Problem Relation Age of Onset   Hypertension Mother    Diabetes Father    Hypertension Father     Social History Social History   Tobacco Use   Smoking status: Never   Smokeless tobacco: Never  Substance Use Topics   Alcohol use: Yes    Comment: occasional wine   Drug use: Never     Allergies   Duloxetine and Amoxicillin   Review of Systems Review of Systems  Constitutional:  Positive for fatigue.  HENT:  Positive for ear pain and sore throat.   Respiratory:  Positive for cough.   Neurological:  Positive for headaches.  All other systems reviewed and are negative.   Physical Exam Triage Vital Signs ED Triage Vitals  Enc Vitals Group      BP 05/13/21 1620 (!) 141/96     Pulse Rate 05/13/21 1618 76     Resp 05/13/21 1618 18     Temp 05/13/21 1618 99.3 F (37.4 C)     Temp Source 05/13/21 1618 Oral     SpO2 05/13/21 1618 100 %     Weight --      Height --      Head Circumference --      Peak Flow --      Pain Score 05/13/21 1615 9     Pain Loc --      Pain Edu? --      Excl. in GC? --    No data found.  Updated Vital Signs BP (!) 141/96 (BP Location: Right Arm)   Pulse 76   Temp 99.3 F (37.4 C) (Oral)   Resp 18   LMP 05/05/2021 (Exact Date)   SpO2 100%   Visual Acuity Right Eye Distance:   Left Eye Distance:   Bilateral Distance:    Right Eye Near:   Left Eye Near:    Bilateral Near:     Physical Exam Vitals and nursing note reviewed.  Constitutional:      General: She is not in acute distress.    Appearance: Normal appearance. She is not ill-appearing, toxic-appearing or diaphoretic.  HENT:     Head: Normocephalic and atraumatic.     Right Ear: Tympanic membrane and ear canal normal.     Left Ear: Tympanic membrane and ear canal normal.     Nose: Congestion present.     Mouth/Throat:     Pharynx: Uvula midline. Pharyngeal swelling and posterior oropharyngeal erythema present. No oropharyngeal exudate or uvula swelling.     Tonsils: No tonsillar exudate or tonsillar abscesses. 1+ on the right. 1+ on the left.  Eyes:     Conjunctiva/sclera: Conjunctivae normal.  Cardiovascular:     Rate and Rhythm: Normal rate and regular rhythm.     Pulses: Normal pulses.     Heart sounds: Normal heart sounds.  Pulmonary:     Effort: Pulmonary effort is normal.     Breath sounds: Normal breath sounds.  Abdominal:     General: Abdomen is flat.  Musculoskeletal:        General: Normal range of motion.     Cervical back: Normal range of motion.  Skin:    General: Skin is warm and dry.  Neurological:     General: No focal deficit present.     Mental Status: She is alert  and oriented to person, place, and  time.  Psychiatric:        Mood and Affect: Mood normal.     UC Treatments / Results  Labs (all labs ordered are listed, but only abnormal results are displayed) Labs Reviewed  CULTURE, GROUP A STREP [facility])  POCT RAPID STREP A, ED / UC    EKG   Radiology No results found.  Procedures Procedures (including critical care time)  Medications Ordered in UC Medications - No data to display  Initial Impression / Assessment and Plan / UC Course  I have reviewed the triage vital signs and the nursing notes.  Pertinent labs & imaging results that were available during my care of the patient were reviewed by me and considered in my medical decision making (see chart for details).    Assessment negative for red flags or concerns.  Rapid strep negative, throat culture pending.  Possible viral pharyngitis and cough.  May take Tessalon and Promethazine DM as needed for cough.  Instructed not to take the Promethazine DM prior to driving as it can cause drowsiness.  Discussed conservative symptom management as described in discharge instructions.  Follow-up with primary care as needed.  Final Clinical Impressions(s) / UC Diagnoses   Final diagnoses:  Pharyngitis, unspecified etiology  Cough     Discharge Instructions      You can take Tylenol and/or Ibuprofen as needed for fever reduction and pain relief.   You can take the Tessalon perles as needed for cough.  You can take the Promethazine DM as needed for cough at night as it can make you sleepy.   For cough: honey 1/2 to 1 teaspoon (you can dilute the honey in water or another fluid).  You can also use guaifenesin and dextromethorphan for cough. You can use a humidifier for chest congestion and cough.  If you don't have a humidifier, you can sit in the bathroom with the hot shower running.     For sore throat: try warm salt water gargles, cepacol lozenges, throat spray, warm tea or water with lemon/honey, popsicles or ice, or OTC  cold relief medicine for throat discomfort.    For congestion: take a daily anti-histamine like Zyrtec, Claritin, and a oral decongestant, such as pseudoephedrine.  You can also use Flonase 1-2 sprays in each nostril daily.    It is important to stay hydrated: drink plenty of fluids (water, gatorade/powerade/pedialyte, juices, or teas) to keep your throat moisturized and help further relieve irritation/discomfort.   Return or go to the Emergency Department if symptoms worsen or do not improve in the next few days.      ED Prescriptions     Medication Sig Dispense Auth. Provider   promethazine-dextromethorphan (PROMETHAZINE-DM) 6.25-15 MG/5ML syrup Take 5 mLs by mouth 4 (four) times daily as needed for cough. 118 mL Ivette Loyal, NP   benzonatate (TESSALON PERLES) 100 MG capsule Take 2 capsules (200 mg total) by mouth 3 (three) times daily as needed for cough. 20 capsule Ivette Loyal, NP      PDMP not reviewed this encounter.   Ivette Loyal, NP 05/13/21 1736

## 2021-05-13 NOTE — ED Triage Notes (Signed)
Pt in with c/o productive cough, headache, ST, bilateral ear pain and neck pain that started on 7/5 and has gotten worse   Pt has been taking otc medication with no relief

## 2021-05-16 LAB — CULTURE, GROUP A STREP (THRC)

## 2021-06-15 IMAGING — CR DG THORACIC SPINE 3V
3 series · 3 of 3 positions shown · non-contrast
Comparison: None.

CLINICAL DATA: Low back pain.  No known injury.

EXAM:
THORACIC SPINE - 3 VIEWS

[w t-spine a.p. *]
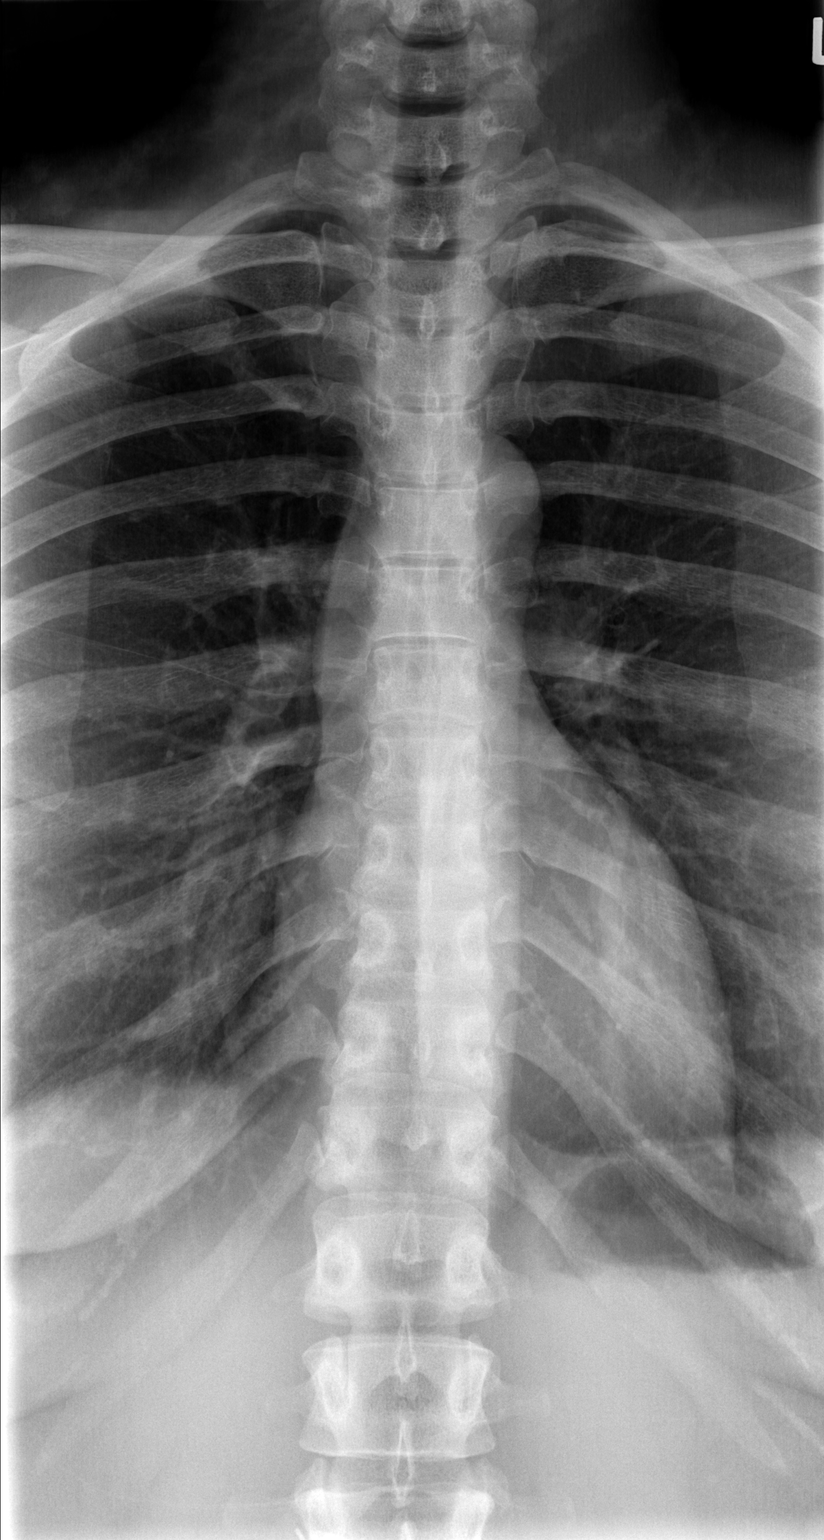

[w t-spine lat *]
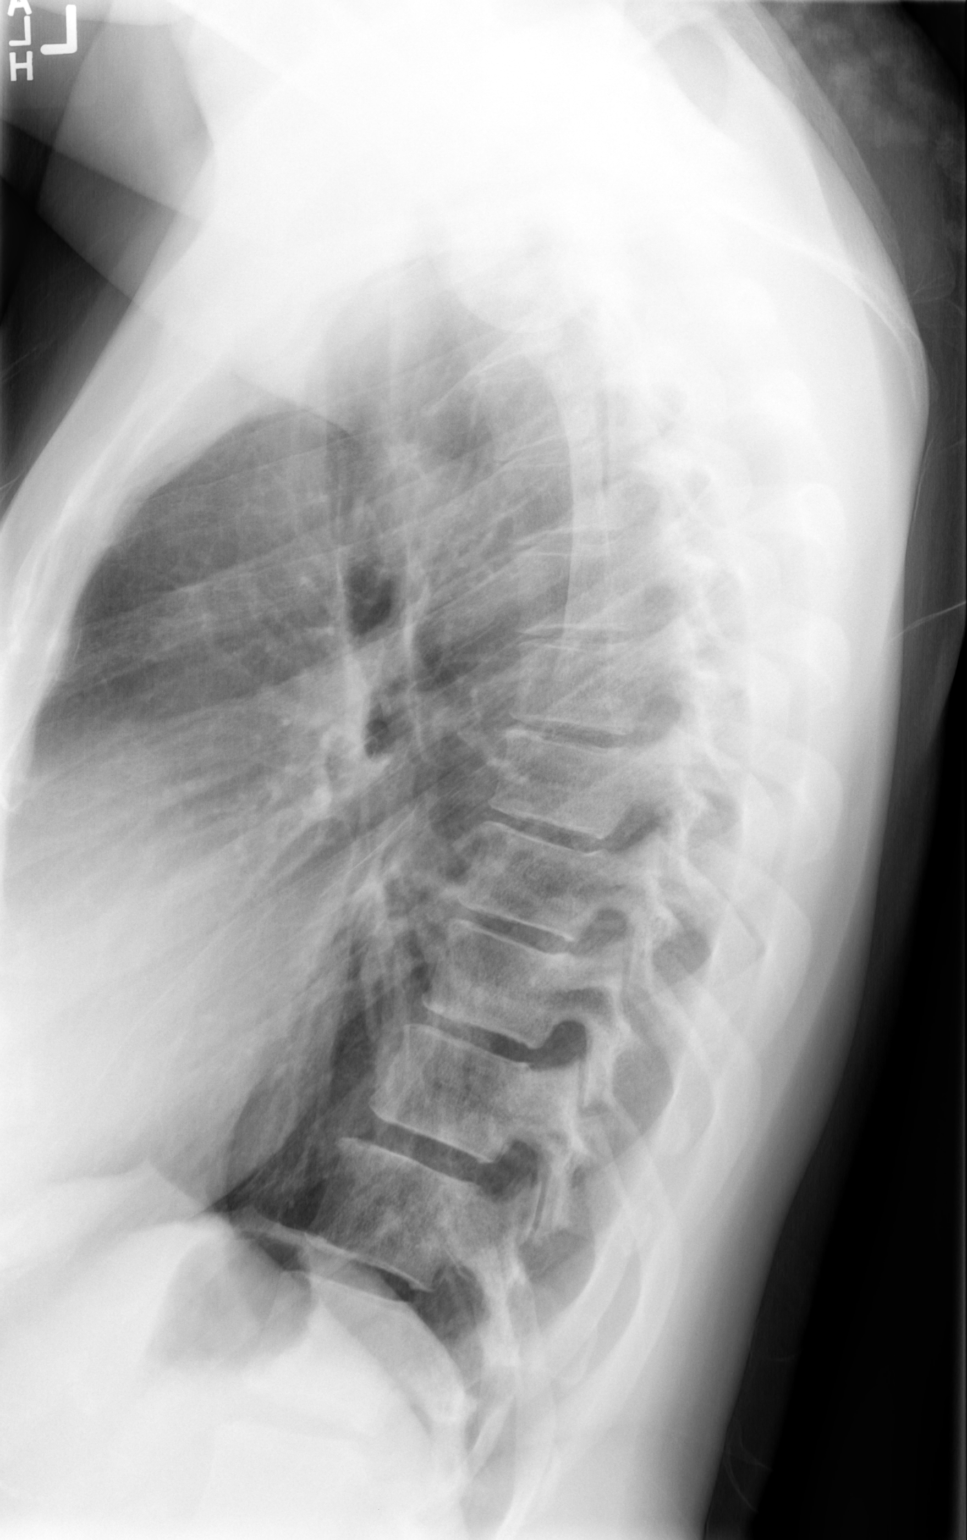

[w swimmers view *]
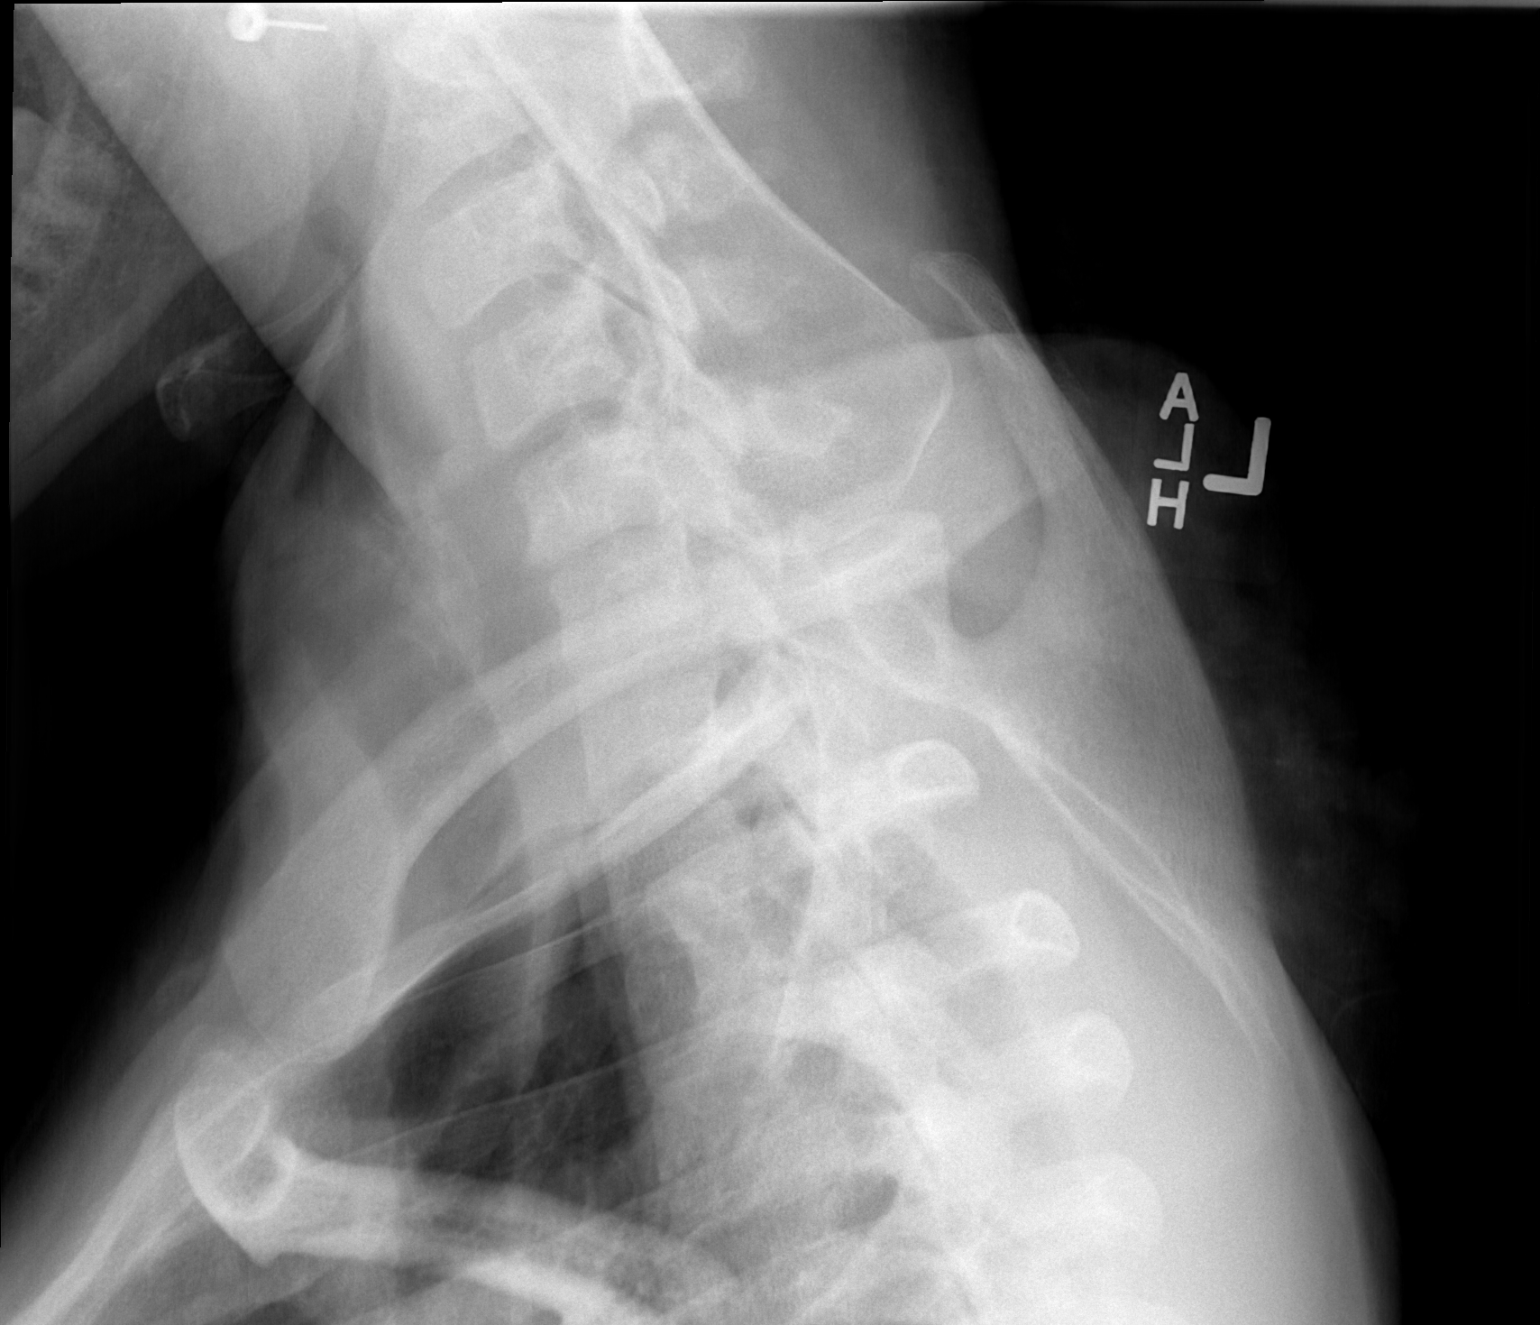

[3 of 3 positions shown; findings below may reference images not displayed]

FINDINGS: There is no evidence of thoracic spine fracture. Alignment is
normal. No other significant bone abnormalities are identified.
IMPRESSION: Negative.
